# Patient Record
Sex: Male | Born: 1990
Health system: Southern US, Community
[De-identification: ages and names within clinical notes are randomized; demographics above are authoritative.]

---

## 2019-07-28 ENCOUNTER — Inpatient Hospital Stay (HOSPITAL_COMMUNITY): Payer: Medicaid - Out of State

## 2019-07-28 ENCOUNTER — Emergency Department (HOSPITAL_COMMUNITY): Payer: Medicaid - Out of State

## 2019-07-28 ENCOUNTER — Other Ambulatory Visit: Payer: Self-pay

## 2019-07-28 ENCOUNTER — Inpatient Hospital Stay (HOSPITAL_COMMUNITY)
Admission: EM | Admit: 2019-07-28 | Discharge: 2019-08-04 | DRG: 958 | Disposition: A | Discharge status: Home / Self Care | Payer: Medicaid - Out of State | Attending: General Surgery | Admitting: General Surgery

## 2019-07-28 ENCOUNTER — Encounter (HOSPITAL_COMMUNITY): Payer: Self-pay

## 2019-07-28 DIAGNOSIS — S73014A Posterior dislocation of right hip, initial encounter: Secondary | ICD-10-CM | POA: Diagnosis present

## 2019-07-28 DIAGNOSIS — S300XXA Contusion of lower back and pelvis, initial encounter: Secondary | ICD-10-CM | POA: Diagnosis present

## 2019-07-28 DIAGNOSIS — Z20822 Contact with and (suspected) exposure to covid-19: Secondary | ICD-10-CM | POA: Diagnosis present

## 2019-07-28 DIAGNOSIS — S32461A Displaced associated transverse-posterior fracture of right acetabulum, initial encounter for closed fracture: Principal | ICD-10-CM | POA: Diagnosis present

## 2019-07-28 DIAGNOSIS — S82871A Displaced pilon fracture of right tibia, initial encounter for closed fracture: Secondary | ICD-10-CM | POA: Diagnosis present

## 2019-07-28 DIAGNOSIS — Z880 Allergy status to penicillin: Secondary | ICD-10-CM

## 2019-07-28 DIAGNOSIS — S329XXA Fracture of unspecified parts of lumbosacral spine and pelvis, initial encounter for closed fracture: Secondary | ICD-10-CM

## 2019-07-28 DIAGNOSIS — J9690 Respiratory failure, unspecified, unspecified whether with hypoxia or hypercapnia: Secondary | ICD-10-CM

## 2019-07-28 DIAGNOSIS — S270XXA Traumatic pneumothorax, initial encounter: Secondary | ICD-10-CM | POA: Diagnosis present

## 2019-07-28 DIAGNOSIS — R7989 Other specified abnormal findings of blood chemistry: Secondary | ICD-10-CM | POA: Diagnosis present

## 2019-07-28 DIAGNOSIS — S32401A Unspecified fracture of right acetabulum, initial encounter for closed fracture: Secondary | ICD-10-CM

## 2019-07-28 DIAGNOSIS — S27321A Contusion of lung, unilateral, initial encounter: Secondary | ICD-10-CM | POA: Diagnosis present

## 2019-07-28 DIAGNOSIS — S9304XA Dislocation of right ankle joint, initial encounter: Secondary | ICD-10-CM | POA: Diagnosis present

## 2019-07-28 DIAGNOSIS — S27329A Contusion of lung, unspecified, initial encounter: Secondary | ICD-10-CM

## 2019-07-28 DIAGNOSIS — S80211A Abrasion, right knee, initial encounter: Secondary | ICD-10-CM | POA: Diagnosis present

## 2019-07-28 DIAGNOSIS — S2232XA Fracture of one rib, left side, initial encounter for closed fracture: Secondary | ICD-10-CM | POA: Diagnosis present

## 2019-07-28 DIAGNOSIS — S50311A Abrasion of right elbow, initial encounter: Secondary | ICD-10-CM | POA: Diagnosis present

## 2019-07-28 DIAGNOSIS — D62 Acute posthemorrhagic anemia: Secondary | ICD-10-CM | POA: Diagnosis not present

## 2019-07-28 DIAGNOSIS — Y92410 Unspecified street and highway as the place of occurrence of the external cause: Secondary | ICD-10-CM

## 2019-07-28 DIAGNOSIS — N179 Acute kidney failure, unspecified: Secondary | ICD-10-CM | POA: Diagnosis present

## 2019-07-28 DIAGNOSIS — S73004A Unspecified dislocation of right hip, initial encounter: Secondary | ICD-10-CM

## 2019-07-28 DIAGNOSIS — Z419 Encounter for procedure for purposes other than remedying health state, unspecified: Secondary | ICD-10-CM

## 2019-07-28 DIAGNOSIS — S93439A Sprain of tibiofibular ligament of unspecified ankle, initial encounter: Secondary | ICD-10-CM | POA: Diagnosis present

## 2019-07-28 DIAGNOSIS — S72009A Fracture of unspecified part of neck of unspecified femur, initial encounter for closed fracture: Secondary | ICD-10-CM | POA: Diagnosis present

## 2019-07-28 DIAGNOSIS — S93431A Sprain of tibiofibular ligament of right ankle, initial encounter: Secondary | ICD-10-CM

## 2019-07-28 DIAGNOSIS — S32810A Multiple fractures of pelvis with stable disruption of pelvic ring, initial encounter for closed fracture: Secondary | ICD-10-CM | POA: Insufficient documentation

## 2019-07-28 DIAGNOSIS — T1490XA Injury, unspecified, initial encounter: Secondary | ICD-10-CM

## 2019-07-28 LAB — RESPIRATORY PANEL BY RT PCR (FLU A&B, COVID)
Influenza A by PCR: NEGATIVE
Influenza B by PCR: NEGATIVE
SARS Coronavirus 2 by RT PCR: NEGATIVE

## 2019-07-28 LAB — I-STAT CHEM 8, ED
BUN: 12 mg/dL (ref 6–20)
Calcium, Ion: 1.1 mmol/L — ABNORMAL LOW (ref 1.15–1.40)
Chloride: 101 mmol/L (ref 98–111)
Creatinine, Ser: 1.5 mg/dL — ABNORMAL HIGH (ref 0.61–1.24)
Glucose, Bld: 154 mg/dL — ABNORMAL HIGH (ref 70–99)
HCT: 46 % (ref 39.0–52.0)
Hemoglobin: 15.6 g/dL (ref 13.0–17.0)
Potassium: 3.4 mmol/L — ABNORMAL LOW (ref 3.5–5.1)
Sodium: 138 mmol/L (ref 135–145)
TCO2: 27 mmol/L (ref 22–32)

## 2019-07-28 LAB — COMPREHENSIVE METABOLIC PANEL
ALT: 39 U/L (ref 0–44)
AST: 75 U/L — ABNORMAL HIGH (ref 15–41)
Albumin: 4.2 g/dL (ref 3.5–5.0)
Alkaline Phosphatase: 40 U/L (ref 38–126)
Anion gap: 15 (ref 5–15)
BUN: 11 mg/dL (ref 6–20)
CO2: 22 mmol/L (ref 22–32)
Calcium: 9.2 mg/dL (ref 8.9–10.3)
Chloride: 101 mmol/L (ref 98–111)
Creatinine, Ser: 1.7 mg/dL — ABNORMAL HIGH (ref 0.61–1.24)
GFR calc Af Amer: >60 mL/min (ref >60)
GFR calc non Af Amer: 54 mL/min — ABNORMAL LOW (ref >60)
Glucose, Bld: 164 mg/dL — ABNORMAL HIGH (ref 70–99)
Potassium: 3.6 mmol/L (ref 3.5–5.1)
Sodium: 138 mmol/L (ref 135–145)
Total Bilirubin: 2.7 mg/dL — ABNORMAL HIGH (ref 0.3–1.2)
Total Protein: 6.7 g/dL (ref 6.5–8.1)

## 2019-07-28 LAB — SAMPLE TO BLOOD BANK

## 2019-07-28 LAB — CDS SEROLOGY

## 2019-07-28 LAB — CBC
HCT: 45.8 % (ref 39.0–52.0)
Hemoglobin: 15.1 g/dL (ref 13.0–17.0)
MCH: 27.7 pg (ref 26.0–34.0)
MCHC: 33 g/dL (ref 30.0–36.0)
MCV: 83.9 fL (ref 80.0–100.0)
Platelets: 325 10*3/uL (ref 150–400)
RBC: 5.46 MIL/uL (ref 4.22–5.81)
RDW: 13 % (ref 11.5–15.5)
WBC: 14.9 10*3/uL — ABNORMAL HIGH (ref 4.0–10.5)
nRBC: 0 % (ref 0.0–0.2)

## 2019-07-28 LAB — PROTIME-INR
INR: 1.2 (ref 0.8–1.2)
Prothrombin Time: 15.2 seconds (ref 11.4–15.2)

## 2019-07-28 LAB — ETHANOL: Alcohol, Ethyl (B): <10 mg/dL (ref <10)

## 2019-07-28 MED ORDER — SODIUM CHLORIDE 0.9 % IV BOLUS
1000.0000 mL | Freq: Once | INTRAVENOUS | Status: AC
  Administered 2019-07-28: 1000 mL via INTRAVENOUS

## 2019-07-28 MED ORDER — ENOXAPARIN SODIUM 40 MG/0.4ML ~~LOC~~ SOLN
40.0000 mg | SUBCUTANEOUS | Status: DC
  Administered 2019-07-29: 40 mg via SUBCUTANEOUS
  Filled 2019-07-28: qty 0.4

## 2019-07-28 MED ORDER — MORPHINE SULFATE (PF) 2 MG/ML IV SOLN: 1.0000 mg | INTRAVENOUS | Status: DC | PRN

## 2019-07-28 MED ORDER — ONDANSETRON HCL 4 MG/2ML IJ SOLN
4.0000 mg | Freq: Four times a day (QID) | INTRAMUSCULAR | Status: DC | PRN
  Filled 2019-07-28 (×2): qty 2

## 2019-07-28 MED ORDER — DOCUSATE SODIUM 100 MG PO CAPS
100.0000 mg | ORAL_CAPSULE | Freq: Two times a day (BID) | ORAL | Status: DC
  Administered 2019-07-28: 100 mg via ORAL
  Filled 2019-07-28: qty 1

## 2019-07-28 MED ORDER — FENTANYL CITRATE (PF) 100 MCG/2ML IJ SOLN
50.0000 ug | Freq: Once | INTRAMUSCULAR | Status: AC
  Administered 2019-07-28: 50 ug via INTRAVENOUS

## 2019-07-28 MED ORDER — PHENYLEPHRINE HCL (PRESSORS) 10 MG/ML IV SOLN
INTRAVENOUS | Status: AC | PRN
  Administered 2019-07-28: 40 ug

## 2019-07-28 MED ORDER — PROPOFOL 10 MG/ML IV BOLUS: 50.0000 mg | Freq: Once | INTRAVENOUS | Status: DC

## 2019-07-28 MED ORDER — ACETAMINOPHEN 500 MG PO TABS
1000.0000 mg | ORAL_TABLET | Freq: Three times a day (TID) | ORAL | Status: DC
  Administered 2019-07-28: 1000 mg via ORAL
  Filled 2019-07-28: qty 2

## 2019-07-28 MED ORDER — POTASSIUM CHLORIDE IN NACL 20-0.9 MEQ/L-% IV SOLN
INTRAVENOUS | Status: DC
  Filled 2019-07-28 (×3): qty 1000

## 2019-07-28 MED ORDER — KETAMINE HCL 10 MG/ML IJ SOLN
INTRAMUSCULAR | Status: AC | PRN
  Administered 2019-07-28: 50 mg via INTRAVENOUS

## 2019-07-28 MED ORDER — PANTOPRAZOLE SODIUM 40 MG PO TBEC
40.0000 mg | DELAYED_RELEASE_TABLET | Freq: Every day | ORAL | Status: DC
  Administered 2019-07-28: 40 mg via ORAL
  Filled 2019-07-28: qty 1

## 2019-07-28 MED ORDER — PANTOPRAZOLE SODIUM 40 MG IV SOLR
40.0000 mg | Freq: Every day | INTRAVENOUS | Status: DC
  Administered 2019-07-29: 40 mg via INTRAVENOUS
  Filled 2019-07-28: qty 40

## 2019-07-28 MED ORDER — HYDROMORPHONE HCL 1 MG/ML IJ SOLN
1.0000 mg | INTRAMUSCULAR | Status: DC | PRN
  Administered 2019-07-28: 1 mg via INTRAVENOUS
  Filled 2019-07-28: qty 1

## 2019-07-28 MED ORDER — PROPOFOL 10 MG/ML IV BOLUS
INTRAVENOUS | Status: AC
  Filled 2019-07-28: qty 20

## 2019-07-28 MED ORDER — PROPOFOL 10 MG/ML IV BOLUS
INTRAVENOUS | Status: AC | PRN
  Administered 2019-07-28: 50 mg via INTRAVENOUS

## 2019-07-28 MED ORDER — OXYCODONE HCL 5 MG PO TABS
10.0000 mg | ORAL_TABLET | ORAL | Status: DC | PRN
  Administered 2019-07-28: 10 mg via ORAL
  Filled 2019-07-28: qty 2

## 2019-07-28 MED ORDER — IOHEXOL 300 MG/ML  SOLN
100.0000 mL | Freq: Once | INTRAMUSCULAR | Status: AC | PRN
  Administered 2019-07-28: 100 mL via INTRAVENOUS

## 2019-07-28 MED ORDER — METHOCARBAMOL 1000 MG/10ML IJ SOLN: 500.0000 mg | Freq: Three times a day (TID) | INTRAVENOUS | Status: DC | PRN

## 2019-07-28 MED ORDER — ONDANSETRON 4 MG PO TBDP: 4.0000 mg | ORAL_TABLET | Freq: Four times a day (QID) | ORAL | Status: DC | PRN

## 2019-07-28 MED ORDER — KETAMINE HCL 50 MG/5ML IJ SOSY
PREFILLED_SYRINGE | INTRAMUSCULAR | Status: AC
  Filled 2019-07-28: qty 10

## 2019-07-28 MED ORDER — OXYCODONE HCL 5 MG PO TABS: 5.0000 mg | ORAL_TABLET | ORAL | Status: DC | PRN

## 2019-07-28 NOTE — Progress Notes (Signed)
Orthopedic Tech Progress Note Patient Details:  David Peterson 01-03-1991 [id number]  Ortho Devices Type of Ortho Device: Knee Immobilizer Ortho Device/Splint Location: RLE Ortho Device/Splint Interventions: Application   Post Interventions Patient Tolerated: Well Instructions Provided: Care of device   David Peterson 07/28/2019, 7:43 PM

## 2019-07-28 NOTE — Consult Note (Signed)
Orthopedic consulted about this patient.  We reviewed the case with Dr. Jena Gauss.  He plans for operative fixation of the ankle as well as the acetabulum pending further imaging tonight.  Patient should be kept n.p.o. at midnight for surgery on 07/29/2019.  I have ordered additional imaging.
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
## 2019-07-28 NOTE — Progress Notes (Signed)
Orthopedic Tech Progress Note Patient Details:  David Peterson 1991/03/27 [id number]  Patient ID: David Peterson, male   DOB: Dec 28, 1990, 28 y.o.   MRN: [id number]   [name] 07/28/2019, 6:08 PMLevel 2 Trauma alert
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
## 2019-07-28 NOTE — Progress Notes (Signed)
Orthopedic Tech Progress Note Patient Details:  David Peterson 09-22-1990 [id number]  Ortho Devices Type of Ortho Device: Stirrup splint, Short leg splint Ortho Device/Splint Location: RLE Ortho Device/Splint Interventions: Application   Post Interventions Patient Tolerated: Well Instructions Provided: Care of device   David Peterson E Fisher Hargadon 07/28/2019, 9:17 PM
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
## 2019-07-29 ENCOUNTER — Inpatient Hospital Stay (HOSPITAL_COMMUNITY): Payer: Medicaid - Out of State | Admitting: Certified Registered"

## 2019-07-29 ENCOUNTER — Inpatient Hospital Stay (HOSPITAL_COMMUNITY): Payer: Medicaid - Out of State

## 2019-07-29 ENCOUNTER — Encounter (HOSPITAL_COMMUNITY): Admission: EM | Disposition: A | Discharge status: Home / Self Care | Payer: Self-pay | Source: Home / Self Care

## 2019-07-29 ENCOUNTER — Encounter (HOSPITAL_COMMUNITY): Payer: Self-pay

## 2019-07-29 HISTORY — PX: ORIF ACETABULAR FRACTURE: SHX5029

## 2019-07-29 HISTORY — PX: EXTERNAL FIXATION LEG: SHX1549

## 2019-07-29 LAB — COMPREHENSIVE METABOLIC PANEL
ALT: 61 U/L — ABNORMAL HIGH (ref 0–44)
AST: 182 U/L — ABNORMAL HIGH (ref 15–41)
Albumin: 3.6 g/dL (ref 3.5–5.0)
Alkaline Phosphatase: 38 U/L (ref 38–126)
Anion gap: 11 (ref 5–15)
BUN: 12 mg/dL (ref 6–20)
CO2: 24 mmol/L (ref 22–32)
Calcium: 8.7 mg/dL — ABNORMAL LOW (ref 8.9–10.3)
Chloride: 102 mmol/L (ref 98–111)
Creatinine, Ser: 1.37 mg/dL — ABNORMAL HIGH (ref 0.61–1.24)
GFR calc Af Amer: >60 mL/min (ref >60)
GFR calc non Af Amer: >60 mL/min (ref >60)
Glucose, Bld: 174 mg/dL — ABNORMAL HIGH (ref 70–99)
Potassium: 5 mmol/L (ref 3.5–5.1)
Sodium: 137 mmol/L (ref 135–145)
Total Bilirubin: 2.2 mg/dL — ABNORMAL HIGH (ref 0.3–1.2)
Total Protein: 6.1 g/dL — ABNORMAL LOW (ref 6.5–8.1)

## 2019-07-29 LAB — CBC
HCT: 40.1 % (ref 39.0–52.0)
Hemoglobin: 13.1 g/dL (ref 13.0–17.0)
MCH: 27.6 pg (ref 26.0–34.0)
MCHC: 32.7 g/dL (ref 30.0–36.0)
MCV: 84.6 fL (ref 80.0–100.0)
Platelets: 283 10*3/uL (ref 150–400)
RBC: 4.74 MIL/uL (ref 4.22–5.81)
RDW: 12.9 % (ref 11.5–15.5)
WBC: 23.5 10*3/uL — ABNORMAL HIGH (ref 4.0–10.5)
nRBC: 0 % (ref 0.0–0.2)

## 2019-07-29 LAB — SURGICAL PCR SCREEN
MRSA, PCR: NEGATIVE
Staphylococcus aureus: NEGATIVE

## 2019-07-29 LAB — HIV ANTIBODY (ROUTINE TESTING W REFLEX): HIV Screen 4th Generation wRfx: NONREACTIVE

## 2019-07-29 SURGERY — OPEN REDUCTION INTERNAL FIXATION (ORIF) ACETABULAR FRACTURE
Anesthesia: General | Site: Shoulder | Laterality: Right

## 2019-07-29 MED ORDER — TOBRAMYCIN SULFATE 1.2 G IJ SOLR
INTRAMUSCULAR | Status: DC | PRN
  Administered 2019-07-29: 1.2 g via TOPICAL

## 2019-07-29 MED ORDER — ONDANSETRON HCL 4 MG/2ML IJ SOLN
4.0000 mg | Freq: Once | INTRAMUSCULAR | Status: AC | PRN
  Administered 2019-07-29: 4 mg via INTRAVENOUS

## 2019-07-29 MED ORDER — MORPHINE SULFATE (PF) 4 MG/ML IV SOLN: 4.0000 mg | INTRAVENOUS | Status: DC | PRN

## 2019-07-29 MED ORDER — ONDANSETRON HCL 4 MG/2ML IJ SOLN
INTRAMUSCULAR | Status: AC
  Filled 2019-07-29: qty 2

## 2019-07-29 MED ORDER — SODIUM CHLORIDE 0.9 % IV SOLN: INTRAVENOUS | Status: DC

## 2019-07-29 MED ORDER — ONDANSETRON HCL 4 MG/2ML IJ SOLN
4.0000 mg | Freq: Four times a day (QID) | INTRAMUSCULAR | Status: DC | PRN
  Administered 2019-08-03: 4 mg via INTRAVENOUS
  Filled 2019-07-29 (×3): qty 2

## 2019-07-29 MED ORDER — ENOXAPARIN SODIUM 40 MG/0.4ML ~~LOC~~ SOLN: 40.0000 mg | SUBCUTANEOUS | Status: DC

## 2019-07-29 MED ORDER — PHENYLEPHRINE HCL-NACL 10-0.9 MG/250ML-% IV SOLN
INTRAVENOUS | Status: DC | PRN
  Administered 2019-07-29: 50 ug/min via INTRAVENOUS

## 2019-07-29 MED ORDER — DEXAMETHASONE SODIUM PHOSPHATE 10 MG/ML IJ SOLN
INTRAMUSCULAR | Status: DC | PRN
  Administered 2019-07-29: 10 mg via INTRAVENOUS

## 2019-07-29 MED ORDER — VANCOMYCIN HCL 1000 MG IV SOLR
INTRAVENOUS | Status: AC
  Filled 2019-07-29: qty 1000

## 2019-07-29 MED ORDER — METHOCARBAMOL 500 MG PO TABS: 500.0000 mg | ORAL_TABLET | Freq: Four times a day (QID) | ORAL | Status: DC | PRN

## 2019-07-29 MED ORDER — ENOXAPARIN SODIUM 30 MG/0.3ML ~~LOC~~ SOLN: 30.0000 mg | Freq: Two times a day (BID) | SUBCUTANEOUS | Status: DC

## 2019-07-29 MED ORDER — ONDANSETRON HCL 4 MG PO TABS: 4.0000 mg | ORAL_TABLET | Freq: Four times a day (QID) | ORAL | Status: DC | PRN

## 2019-07-29 MED ORDER — LIDOCAINE 2% (20 MG/ML) 5 ML SYRINGE
INTRAMUSCULAR | Status: AC
  Filled 2019-07-29: qty 5

## 2019-07-29 MED ORDER — HYDROMORPHONE HCL 1 MG/ML IJ SOLN
0.2500 mg | INTRAMUSCULAR | Status: DC | PRN
  Administered 2019-07-29 (×2): 0.5 mg via INTRAVENOUS

## 2019-07-29 MED ORDER — POVIDONE-IODINE 10 % EX SWAB: 2.0000 "application " | Freq: Once | CUTANEOUS | Status: DC

## 2019-07-29 MED ORDER — MIDAZOLAM HCL 2 MG/2ML IJ SOLN
INTRAMUSCULAR | Status: AC
  Filled 2019-07-29: qty 2

## 2019-07-29 MED ORDER — LIDOCAINE 2% (20 MG/ML) 5 ML SYRINGE
INTRAMUSCULAR | Status: DC | PRN
  Administered 2019-07-29: 100 mg via INTRAVENOUS

## 2019-07-29 MED ORDER — FENTANYL CITRATE (PF) 250 MCG/5ML IJ SOLN
INTRAMUSCULAR | Status: AC
  Filled 2019-07-29: qty 5

## 2019-07-29 MED ORDER — GABAPENTIN 100 MG PO CAPS
100.0000 mg | ORAL_CAPSULE | Freq: Three times a day (TID) | ORAL | Status: DC
  Administered 2019-07-30 – 2019-08-01 (×9): 100 mg via ORAL
  Filled 2019-07-29 (×10): qty 1

## 2019-07-29 MED ORDER — CEFAZOLIN SODIUM-DEXTROSE 2-3 GM-%(50ML) IV SOLR
INTRAVENOUS | Status: DC | PRN
  Administered 2019-07-29: 2 g via INTRAVENOUS

## 2019-07-29 MED ORDER — VANCOMYCIN HCL IN DEXTROSE 1-5 GM/200ML-% IV SOLN
INTRAVENOUS | Status: AC
  Administered 2019-07-29: 1000 mg via INTRAVENOUS
  Filled 2019-07-29: qty 200

## 2019-07-29 MED ORDER — DEXMEDETOMIDINE HCL IN NACL 200 MCG/50ML IV SOLN
INTRAVENOUS | Status: AC
  Filled 2019-07-29: qty 50

## 2019-07-29 MED ORDER — LACTATED RINGERS IV SOLN: INTRAVENOUS | Status: DC

## 2019-07-29 MED ORDER — PHENYLEPHRINE 40 MCG/ML (10ML) SYRINGE FOR IV PUSH (FOR BLOOD PRESSURE SUPPORT)
PREFILLED_SYRINGE | INTRAVENOUS | Status: DC | PRN
  Administered 2019-07-29 (×3): 80 ug via INTRAVENOUS
  Administered 2019-07-29 (×2): 120 ug via INTRAVENOUS

## 2019-07-29 MED ORDER — ALBUMIN HUMAN 5 % IV SOLN: INTRAVENOUS | Status: DC | PRN

## 2019-07-29 MED ORDER — MIDAZOLAM HCL 2 MG/2ML IJ SOLN
INTRAMUSCULAR | Status: DC | PRN
  Administered 2019-07-29: 2 mg via INTRAVENOUS

## 2019-07-29 MED ORDER — VANCOMYCIN HCL IN DEXTROSE 1-5 GM/200ML-% IV SOLN: 1000.0000 mg | INTRAVENOUS | Status: AC

## 2019-07-29 MED ORDER — DEXAMETHASONE SODIUM PHOSPHATE 10 MG/ML IJ SOLN
INTRAMUSCULAR | Status: AC
  Filled 2019-07-29: qty 1

## 2019-07-29 MED ORDER — ROCURONIUM BROMIDE 10 MG/ML (PF) SYRINGE
PREFILLED_SYRINGE | INTRAVENOUS | Status: AC
  Filled 2019-07-29: qty 20

## 2019-07-29 MED ORDER — HYDROMORPHONE HCL 1 MG/ML IJ SOLN: 0.5000 mg | INTRAMUSCULAR | Status: DC | PRN

## 2019-07-29 MED ORDER — DEXMEDETOMIDINE HCL 200 MCG/2ML IV SOLN
INTRAVENOUS | Status: DC | PRN
  Administered 2019-07-29: 20 ug via INTRAVENOUS

## 2019-07-29 MED ORDER — METOCLOPRAMIDE HCL 5 MG/ML IJ SOLN: 5.0000 mg | Freq: Three times a day (TID) | INTRAMUSCULAR | Status: DC | PRN

## 2019-07-29 MED ORDER — CHLORHEXIDINE GLUCONATE 4 % EX LIQD: 60.0000 mL | Freq: Once | CUTANEOUS | Status: DC

## 2019-07-29 MED ORDER — ONDANSETRON HCL 4 MG/2ML IJ SOLN
INTRAMUSCULAR | Status: DC | PRN
  Administered 2019-07-29: 4 mg via INTRAVENOUS

## 2019-07-29 MED ORDER — CEFAZOLIN SODIUM-DEXTROSE 2-4 GM/100ML-% IV SOLN
2.0000 g | Freq: Three times a day (TID) | INTRAVENOUS | Status: AC
  Administered 2019-07-30 (×3): 2 g via INTRAVENOUS
  Filled 2019-07-29 (×3): qty 100

## 2019-07-29 MED ORDER — PROPOFOL 10 MG/ML IV BOLUS
INTRAVENOUS | Status: AC
  Filled 2019-07-29: qty 20

## 2019-07-29 MED ORDER — DOCUSATE SODIUM 100 MG PO CAPS
100.0000 mg | ORAL_CAPSULE | Freq: Two times a day (BID) | ORAL | Status: DC
  Administered 2019-07-30 – 2019-08-03 (×9): 100 mg via ORAL
  Filled 2019-07-29 (×11): qty 1

## 2019-07-29 MED ORDER — TOBRAMYCIN SULFATE 1.2 G IJ SOLR
INTRAMUSCULAR | Status: AC
  Filled 2019-07-29: qty 1.2

## 2019-07-29 MED ORDER — PHENYLEPHRINE 40 MCG/ML (10ML) SYRINGE FOR IV PUSH (FOR BLOOD PRESSURE SUPPORT)
PREFILLED_SYRINGE | INTRAVENOUS | Status: AC
  Filled 2019-07-29: qty 10

## 2019-07-29 MED ORDER — METHOCARBAMOL 500 MG PO TABS: 1000.0000 mg | ORAL_TABLET | Freq: Three times a day (TID) | ORAL | Status: DC

## 2019-07-29 MED ORDER — VANCOMYCIN HCL 1 G IV SOLR
INTRAVENOUS | Status: DC | PRN
  Administered 2019-07-29: 1000 mg via TOPICAL

## 2019-07-29 MED ORDER — ROCURONIUM BROMIDE 10 MG/ML (PF) SYRINGE
PREFILLED_SYRINGE | INTRAVENOUS | Status: DC | PRN
  Administered 2019-07-29: 20 mg via INTRAVENOUS
  Administered 2019-07-29: 50 mg via INTRAVENOUS
  Administered 2019-07-29: 30 mg via INTRAVENOUS
  Administered 2019-07-29: 80 mg via INTRAVENOUS
  Administered 2019-07-29: 20 mg via INTRAVENOUS

## 2019-07-29 MED ORDER — HYDROMORPHONE HCL 1 MG/ML IJ SOLN
INTRAMUSCULAR | Status: AC
  Filled 2019-07-29: qty 1

## 2019-07-29 MED ORDER — POLYETHYLENE GLYCOL 3350 17 G PO PACK: 17.0000 g | PACK | Freq: Every day | ORAL | Status: DC | PRN

## 2019-07-29 MED ORDER — 0.9 % SODIUM CHLORIDE (POUR BTL) OPTIME
TOPICAL | Status: DC | PRN
  Administered 2019-07-29: 1000 mL

## 2019-07-29 MED ORDER — FENTANYL CITRATE (PF) 250 MCG/5ML IJ SOLN
INTRAMUSCULAR | Status: DC | PRN
  Administered 2019-07-29: 150 ug via INTRAVENOUS
  Administered 2019-07-29 (×2): 50 ug via INTRAVENOUS

## 2019-07-29 MED ORDER — METHOCARBAMOL 1000 MG/10ML IJ SOLN: 500.0000 mg | Freq: Four times a day (QID) | INTRAVENOUS | Status: DC | PRN

## 2019-07-29 MED ORDER — METOCLOPRAMIDE HCL 5 MG PO TABS: 5.0000 mg | ORAL_TABLET | Freq: Three times a day (TID) | ORAL | Status: DC | PRN

## 2019-07-29 MED ORDER — OXYCODONE HCL 5 MG PO TABS
10.0000 mg | ORAL_TABLET | ORAL | Status: DC | PRN
  Administered 2019-07-30 – 2019-08-04 (×12): 15 mg via ORAL
  Filled 2019-07-29 (×14): qty 3

## 2019-07-29 MED ORDER — ACETAMINOPHEN 500 MG PO TABS
1000.0000 mg | ORAL_TABLET | Freq: Four times a day (QID) | ORAL | Status: DC
  Administered 2019-07-30 – 2019-08-04 (×11): 1000 mg via ORAL
  Filled 2019-07-29 (×17): qty 2

## 2019-07-29 MED ORDER — MEPERIDINE HCL 25 MG/ML IJ SOLN: 6.2500 mg | INTRAMUSCULAR | Status: DC | PRN

## 2019-07-29 MED ORDER — PROPOFOL 10 MG/ML IV BOLUS
INTRAVENOUS | Status: DC | PRN
  Administered 2019-07-29: 150 mg via INTRAVENOUS
  Administered 2019-07-29: 50 mg via INTRAVENOUS

## 2019-07-29 MED ORDER — SUGAMMADEX SODIUM 200 MG/2ML IV SOLN
INTRAVENOUS | Status: DC | PRN
  Administered 2019-07-29: 200 mg via INTRAVENOUS

## 2019-07-29 MED ORDER — OXYCODONE HCL 5 MG PO TABS
5.0000 mg | ORAL_TABLET | ORAL | Status: DC | PRN
  Administered 2019-08-02 – 2019-08-03 (×5): 10 mg via ORAL
  Filled 2019-07-29 (×6): qty 2

## 2019-07-29 SURGICAL SUPPLY — 107 items
BIT DRILL CANN 4.5MM (BIT) ×4 IMPLANT
BIT DRILL STEP 3.5 (DRILL) IMPLANT
BLADE CLIPPER SURG (BLADE) IMPLANT
BNDG COHESIVE 4X5 TAN STRL (GAUZE/BANDAGES/DRESSINGS) ×4 IMPLANT
BNDG ELASTIC 4X5.8 VLCR STR LF (GAUZE/BANDAGES/DRESSINGS) ×4 IMPLANT
BNDG ELASTIC 6X5.8 VLCR STR LF (GAUZE/BANDAGES/DRESSINGS) ×4 IMPLANT
BNDG GAUZE ELAST 4 BULKY (GAUZE/BANDAGES/DRESSINGS) ×4 IMPLANT
BRUSH SCRUB EZ PLAIN DRY (MISCELLANEOUS) ×8 IMPLANT
CHLORAPREP W/TINT 26 (MISCELLANEOUS) ×4 IMPLANT
CLAMP COMBI 8.0/11.0 LRG/MED (Clamp) ×8 IMPLANT
CLAMP COMBO MED CLIP-ON SELF (EXFIX) ×16 IMPLANT
CLAMP LG MULTI PIN (Clamp) ×4 IMPLANT
CLAMP ROD ATTACHMENT (Clamp) ×4 IMPLANT
CLOSURE WOUND 1/2 X4 (GAUZE/BANDAGES/DRESSINGS)
COVER SURGICAL LIGHT HANDLE (MISCELLANEOUS) ×8 IMPLANT
COVER WAND RF STERILE (DRAPES) ×4 IMPLANT
DERMABOND ADVANCED (GAUZE/BANDAGES/DRESSINGS) ×4
DERMABOND ADVANCED .7 DNX12 (GAUZE/BANDAGES/DRESSINGS) ×4 IMPLANT
DRAPE C-ARM 42X72 X-RAY (DRAPES) ×12 IMPLANT
DRAPE C-ARMOR (DRAPES) ×4 IMPLANT
DRAPE IMP U-DRAPE 54X76 (DRAPES) ×8 IMPLANT
DRAPE INCISE IOBAN 66X45 STRL (DRAPES) ×8 IMPLANT
DRAPE INCISE IOBAN 85X60 (DRAPES) ×4 IMPLANT
DRAPE ORTHO SPLIT 77X108 STRL (DRAPES) ×4
DRAPE SURG ORHT 6 SPLT 77X108 (DRAPES) ×4 IMPLANT
DRAPE U-SHAPE 47X51 STRL (DRAPES) ×4 IMPLANT
DRILL BIT CANN 4.5MM (BIT) ×4
DRILL STEP 3.5 (DRILL)
DRSG ADAPTIC 3X8 NADH LF (GAUZE/BANDAGES/DRESSINGS) ×4 IMPLANT
DRSG MEPILEX BORDER 4X12 (GAUZE/BANDAGES/DRESSINGS) ×4 IMPLANT
DRSG MEPILEX BORDER 4X4 (GAUZE/BANDAGES/DRESSINGS) ×4 IMPLANT
DRSG MEPILEX BORDER 4X8 (GAUZE/BANDAGES/DRESSINGS) IMPLANT
DRSG MEPITEL 4X7.2 (GAUZE/BANDAGES/DRESSINGS) IMPLANT
ELECT BLADE 4.0 EZ CLEAN MEGAD (MISCELLANEOUS) ×4
ELECT BLADE 6.5 EXT (BLADE) ×4 IMPLANT
ELECT REM PT RETURN 9FT ADLT (ELECTROSURGICAL) ×4
ELECTRODE BLDE 4.0 EZ CLN MEGD (MISCELLANEOUS) ×2 IMPLANT
ELECTRODE REM PT RTRN 9FT ADLT (ELECTROSURGICAL) ×2 IMPLANT
GAUZE SPONGE 4X4 12PLY STRL (GAUZE/BANDAGES/DRESSINGS) ×4 IMPLANT
GLOVE BIO SURGEON STRL SZ 6.5 (GLOVE) ×9 IMPLANT
GLOVE BIO SURGEON STRL SZ7.5 (GLOVE) ×16 IMPLANT
GLOVE BIO SURGEONS STRL SZ 6.5 (GLOVE) ×3
GLOVE BIOGEL PI IND STRL 6.5 (GLOVE) ×2 IMPLANT
GLOVE BIOGEL PI IND STRL 7.5 (GLOVE) ×2 IMPLANT
GLOVE BIOGEL PI INDICATOR 6.5 (GLOVE) ×2
GLOVE BIOGEL PI INDICATOR 7.5 (GLOVE) ×2
GOWN STRL REUS W/ TWL LRG LVL3 (GOWN DISPOSABLE) ×4 IMPLANT
GOWN STRL REUS W/TWL LRG LVL3 (GOWN DISPOSABLE) ×4
GUIDEWARE NON THREAD 1.25X150 (WIRE) ×4
GUIDEWIRE 2.0MM (WIRE) ×8 IMPLANT
GUIDEWIRE NON THREAD 1.25X150 (WIRE) ×2 IMPLANT
GUIDEWIRE THREADED 2.8 (WIRE) ×4 IMPLANT
HANDPIECE INTERPULSE COAX TIP (DISPOSABLE) ×2
KIT BASIN OR (CUSTOM PROCEDURE TRAY) ×4 IMPLANT
KIT TURNOVER KIT B (KITS) ×4 IMPLANT
MANIFOLD NEPTUNE II (INSTRUMENTS) ×4 IMPLANT
NEEDLE 22X1 1/2 (OR ONLY) (NEEDLE) IMPLANT
NS IRRIG 1000ML POUR BTL (IV SOLUTION) ×4 IMPLANT
PACK ORTHO EXTREMITY (CUSTOM PROCEDURE TRAY) ×4 IMPLANT
PACK TOTAL JOINT (CUSTOM PROCEDURE TRAY) ×4 IMPLANT
PAD ARMBOARD 7.5X6 YLW CONV (MISCELLANEOUS) ×8 IMPLANT
PAD CAST 4YDX4 CTTN HI CHSV (CAST SUPPLIES) ×2 IMPLANT
PADDING CAST COTTON 4X4 STRL (CAST SUPPLIES) ×2
PADDING CAST COTTON 6X4 STRL (CAST SUPPLIES) ×8 IMPLANT
PIN SHANZ TRANSFIX 6.0X225 (PIN) ×4 IMPLANT
PLATE BONE LOCK 65MM 5 HOLE (Plate) ×4 IMPLANT
RETRIEVER SUT HEWSON (MISCELLANEOUS) ×4 IMPLANT
ROD CARBON FIBER 500MM (Rod) ×8 IMPLANT
ROD CARBON FIBER 8.0MMX200MM (Rod) ×8 IMPLANT
SCREW CANN 16 THRD/125 7.3 (Screw) ×4 IMPLANT
SCREW CANN 16 THRD/95 7.3 (Screw) ×4 IMPLANT
SCREW CORTEX 3.5 38MM (Screw) ×2 IMPLANT
SCREW CORTEX 3.5X40MM (Screw) ×8 IMPLANT
SCREW CORTEX 3.5X50MM (Screw) ×2 IMPLANT
SCREW LOCK CORT ST 3.5X38 (Screw) ×2 IMPLANT
SCREW PELVIC CORT ST 3.5X50 (Screw) ×2 IMPLANT
SCREW PELVIC CORT ST 3.5X60 (Screw) ×2 IMPLANT
SCREW SELF TAP 3.5 60M (Screw) ×2 IMPLANT
SCREW SHANZ 4.0X100 (EXFIX) ×8 IMPLANT
SCREW SHANZ 5.0X175MM (Screw) ×8 IMPLANT
SET HNDPC FAN SPRY TIP SCT (DISPOSABLE) ×2 IMPLANT
SPONGE LAP 18X18 RF (DISPOSABLE) ×4 IMPLANT
STAPLER VISISTAT 35W (STAPLE) ×4 IMPLANT
STRIP CLOSURE SKIN 1/2X4 (GAUZE/BANDAGES/DRESSINGS) IMPLANT
SUCTION FRAZIER HANDLE 10FR (MISCELLANEOUS) ×2
SUCTION TUBE FRAZIER 10FR DISP (MISCELLANEOUS) ×2 IMPLANT
SUT ETHILON 2 0 PSLX (SUTURE) ×8 IMPLANT
SUT ETHILON 3 0 PS 1 (SUTURE) ×4 IMPLANT
SUT FIBERWIRE #2 38 T-5 BLUE (SUTURE) ×8
SUT MNCRL AB 3-0 PS2 18 (SUTURE) ×4 IMPLANT
SUT MON AB 2-0 CT1 36 (SUTURE) ×4 IMPLANT
SUT PROLENE 0 CT (SUTURE) IMPLANT
SUT VIC AB 0 CT1 27 (SUTURE) ×2
SUT VIC AB 0 CT1 27XBRD ANBCTR (SUTURE) ×2 IMPLANT
SUT VIC AB 1 CT1 18XCR BRD 8 (SUTURE) ×2 IMPLANT
SUT VIC AB 1 CT1 27 (SUTURE) ×2
SUT VIC AB 1 CT1 27XBRD ANBCTR (SUTURE) ×2 IMPLANT
SUT VIC AB 1 CT1 8-18 (SUTURE) ×2
SUT VIC AB 2-0 CT1 27 (SUTURE) ×2
SUT VIC AB 2-0 CT1 TAPERPNT 27 (SUTURE) ×2 IMPLANT
SUTURE FIBERWR #2 38 T-5 BLUE (SUTURE) ×4 IMPLANT
TOWEL GREEN STERILE (TOWEL DISPOSABLE) ×8 IMPLANT
TOWEL GREEN STERILE FF (TOWEL DISPOSABLE) ×8 IMPLANT
TRAY FOLEY MTR SLVR 16FR STAT (SET/KITS/TRAYS/PACK) IMPLANT
UNDERPAD 30X30 (UNDERPADS AND DIAPERS) ×4 IMPLANT
WASHER FOR 5.0 SCREWS (Washer) ×4 IMPLANT
WATER STERILE IRR 1000ML POUR (IV SOLUTION) IMPLANT
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
## 2019-07-29 NOTE — Transfer of Care (Signed)
Immediate Anesthesia Transfer of Care Note  Patient: David Peterson  Procedure(s) Performed: OPEN REDUCTION INTERNAL FIXATION (ORIF) ACETABULAR FRACTURE (Right Shoulder) EXTERNAL FIXATION LEG (Right )  Patient Location: PACU  Anesthesia Type:General  Level of Consciousness: sedated, drowsy, patient cooperative and responds to stimulation  Airway & Oxygen Therapy: Patient Spontanous Breathing and Patient connected to nasal cannula oxygen  Post-op Assessment: Report given to RN, Post -op Vital signs reviewed and stable and Patient moving all extremities X 4  Post vital signs: Reviewed and stable  Last Vitals:  Vitals Value Taken Time  BP 132/70 07/29/19 2148  Temp    Pulse 91 07/29/19 2153  Resp 12 07/29/19 2153  SpO2 100 % 07/29/19 2153  Vitals shown include unvalidated device data.  Last Pain:  Vitals:   07/29/19 0920  TempSrc: Oral  PainSc:       Patients Stated Pain Goal: 3 (07/29/19 0920)  Complications: No apparent anesthesia complications

## 2019-07-29 NOTE — Anesthesia Preprocedure Evaluation (Signed)
Anesthesia Evaluation  Patient identified by MRN, date of birth, ID band Patient awake    Reviewed: Allergy & Precautions, NPO status , Patient's Chart, lab work & pertinent test results  Airway Mallampati: II  TM Distance: >3 FB Neck ROM: Full    Dental no notable dental hx. (+) Teeth Intact   Pulmonary  Pulmonary contusion   Pulmonary exam normal breath sounds clear to auscultation       Cardiovascular negative cardio ROS Normal cardiovascular exam Rhythm:Regular Rate:Normal     Neuro/Psych negative neurological ROS  negative psych ROS   GI/Hepatic negative GI ROS, Neg liver ROS,   Endo/Other  negative endocrine ROS  Renal/GU negative Renal ROS     Musculoskeletal Right acetabular Fx Right ankle Fx   Abdominal   Peds  Hematology negative hematology ROS (+)   Anesthesia Other Findings   Reproductive/Obstetrics                             Anesthesia Physical Anesthesia Plan  ASA: I  Anesthesia Plan: General   Post-op Pain Management:    Induction: Intravenous  PONV Risk Score and Plan: 2 and Ondansetron, Dexamethasone, Midazolam and Treatment may vary due to age or medical condition  Airway Management Planned: Oral ETT  Additional Equipment:   Intra-op Plan:   Post-operative Plan: Extubation in OR  Informed Consent: I have reviewed the patients History and Physical, chart, labs and discussed the procedure including the risks, benefits and alternatives for the proposed anesthesia with the patient or authorized representative who has indicated his/her understanding and acceptance.     Dental advisory given  Plan Discussed with: CRNA and Surgeon  Anesthesia Plan Comments:         Anesthesia Quick Evaluation

## 2019-07-29 NOTE — Anesthesia Procedure Notes (Signed)
Procedure Name: Intubation Date/Time: 07/29/2019 4:38 PM Performed by: Adair Laundry, CRNA Pre-anesthesia Checklist: Suction available, Emergency Drugs available and Patient being monitored Patient Re-evaluated:Patient Re-evaluated prior to induction Oxygen Delivery Method: Circle system utilized Preoxygenation: Pre-oxygenation with 100% oxygen Induction Type: IV induction Ventilation: Mask ventilation without difficulty Laryngoscope Size: Miller and 2 Grade View: Grade I Tube type: Oral Tube size: 7.5 mm Number of attempts: 1 Airway Equipment and Method: Stylet Placement Confirmation: ETT inserted through vocal cords under direct vision,  positive ETCO2 and breath sounds checked- equal and bilateral Secured at: 23 cm Tube secured with: Tape Dental Injury: Teeth and Oropharynx as per pre-operative assessment

## 2019-07-30 ENCOUNTER — Inpatient Hospital Stay (HOSPITAL_COMMUNITY): Payer: Medicaid - Out of State

## 2019-07-30 LAB — BASIC METABOLIC PANEL
Anion gap: 9 (ref 5–15)
BUN: 15 mg/dL (ref 6–20)
CO2: 25 mmol/L (ref 22–32)
Calcium: 8.3 mg/dL — ABNORMAL LOW (ref 8.9–10.3)
Chloride: 101 mmol/L (ref 98–111)
Creatinine, Ser: 1.28 mg/dL — ABNORMAL HIGH (ref 0.61–1.24)
GFR calc Af Amer: >60 mL/min (ref >60)
GFR calc non Af Amer: >60 mL/min (ref >60)
Glucose, Bld: 141 mg/dL — ABNORMAL HIGH (ref 70–99)
Potassium: 4.5 mmol/L (ref 3.5–5.1)
Sodium: 135 mmol/L (ref 135–145)

## 2019-07-30 LAB — CBC
HCT: 24.9 % — ABNORMAL LOW (ref 39.0–52.0)
Hemoglobin: 8.4 g/dL — ABNORMAL LOW (ref 13.0–17.0)
MCH: 27.8 pg (ref 26.0–34.0)
MCHC: 33.7 g/dL (ref 30.0–36.0)
MCV: 82.5 fL (ref 80.0–100.0)
Platelets: 178 10*3/uL (ref 150–400)
RBC: 3.02 MIL/uL — ABNORMAL LOW (ref 4.22–5.81)
RDW: 12.8 % (ref 11.5–15.5)
WBC: 15.1 10*3/uL — ABNORMAL HIGH (ref 4.0–10.5)
nRBC: 0 % (ref 0.0–0.2)

## 2019-07-30 LAB — PHOSPHORUS: Phosphorus: 3.1 mg/dL (ref 2.5–4.6)

## 2019-07-30 LAB — VITAMIN D 25 HYDROXY (VIT D DEFICIENCY, FRACTURES): Vit D, 25-Hydroxy: 10.66 ng/mL — ABNORMAL LOW (ref 30–100)

## 2019-07-30 LAB — MAGNESIUM: Magnesium: 1.7 mg/dL (ref 1.7–2.4)

## 2019-07-30 MED ORDER — ENOXAPARIN SODIUM 40 MG/0.4ML ~~LOC~~ SOLN
40.0000 mg | SUBCUTANEOUS | Status: DC
  Filled 2019-07-30: qty 0.4
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
## 2019-07-30 NOTE — Plan of Care (Signed)

## 2019-07-30 NOTE — Progress Notes (Signed)
Rehab Admissions Coordinator Note:  Per PT and OT recommendation, this patient was screened by Cheri Rous for appropriateness for an Inpatient Acute Rehab Consult.  Noted pt unable to tolerate transfers OOB at this time and he is awaiting definitive fixation of his RLE that may be up to a week away. Will continue to follow pt progress while on acute and request consult order once appropriate.    Cheri Rous 07/30/2019, 4:38 PM  I can be reached at [phone].

## 2019-07-30 NOTE — Progress Notes (Signed)
Pt. returned to 6N14 from PACU in stable condition. Alert and oriented x4, VSS, pt. was reoriented to room and to call bell. Pt. is sleeping but easily aroused and has no complaints at this time. Will continue to monitor.

## 2019-07-30 NOTE — Plan of Care (Signed)
  Problem: Education: Goal: Knowledge of General Education information will improve Description: Including pain rating scale, medication(s)/side effects and non-pharmacologic comfort measures Outcome: Progressing   Problem: Clinical Measurements: Goal: Ability to maintain clinical measurements within normal limits will improve Outcome: Progressing Goal: Will remain free from infection Outcome: Progressing Goal: Diagnostic test results will improve Outcome: Progressing Goal: Respiratory complications will improve Outcome: Progressing Goal: Cardiovascular complication will be avoided Outcome: Progressing   Problem: Nutrition: Goal: Adequate nutrition will be maintained Outcome: Progressing   Problem: Coping: Goal: Level of anxiety will decrease Outcome: Progressing   Problem: Pain Managment: Goal: General experience of comfort will improve Outcome: Progressing   Problem: Safety: Goal: Ability to remain free from injury will improve Outcome: Progressing   Problem: Skin Integrity: Goal: Risk for impaired skin integrity will decrease Outcome: Progressing   Problem: Education: Goal: Required Educational Video(s) Outcome: Progressing   Problem: Clinical Measurements: Goal: Ability to maintain clinical measurements within normal limits will improve Outcome: Progressing Goal: Postoperative complications will be avoided or minimized Outcome: Progressing   Problem: Skin Integrity: Goal: Demonstration of wound healing without infection will improve Outcome: Progressing
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
## 2019-07-30 NOTE — Progress Notes (Signed)
Patient refusing lovenox injection despite explanation .

## 2019-07-31 LAB — CBC
HCT: 21.9 % — ABNORMAL LOW (ref 39.0–52.0)
Hemoglobin: 7.4 g/dL — ABNORMAL LOW (ref 13.0–17.0)
MCH: 28 pg (ref 26.0–34.0)
MCHC: 33.8 g/dL (ref 30.0–36.0)
MCV: 83 fL (ref 80.0–100.0)
Platelets: 148 10*3/uL — ABNORMAL LOW (ref 150–400)
RBC: 2.64 MIL/uL — ABNORMAL LOW (ref 4.22–5.81)
RDW: 12.7 % (ref 11.5–15.5)
WBC: 9.6 10*3/uL (ref 4.0–10.5)
nRBC: 0 % (ref 0.0–0.2)

## 2019-07-31 LAB — BASIC METABOLIC PANEL
Anion gap: 7 (ref 5–15)
BUN: 7 mg/dL (ref 6–20)
CO2: 30 mmol/L (ref 22–32)
Calcium: 8 mg/dL — ABNORMAL LOW (ref 8.9–10.3)
Chloride: 100 mmol/L (ref 98–111)
Creatinine, Ser: 0.93 mg/dL (ref 0.61–1.24)
GFR calc Af Amer: >60 mL/min (ref >60)
GFR calc non Af Amer: >60 mL/min (ref >60)
Glucose, Bld: 106 mg/dL — ABNORMAL HIGH (ref 70–99)
Potassium: 3.9 mmol/L (ref 3.5–5.1)
Sodium: 137 mmol/L (ref 135–145)

## 2019-07-31 LAB — MAGNESIUM: Magnesium: 1.9 mg/dL (ref 1.7–2.4)

## 2019-07-31 LAB — PHOSPHORUS: Phosphorus: 2.3 mg/dL — ABNORMAL LOW (ref 2.5–4.6)

## 2019-07-31 NOTE — Plan of Care (Signed)
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
## 2019-07-31 NOTE — Anesthesia Postprocedure Evaluation (Signed)
Anesthesia Post Note  Patient: David Peterson  Procedure(s) Performed: OPEN REDUCTION INTERNAL FIXATION (ORIF) ACETABULAR FRACTURE (Right Shoulder) EXTERNAL FIXATION LEG (Right )     Patient location during evaluation: PACU Anesthesia Type: General Level of consciousness: awake and alert Pain management: pain level controlled Vital Signs Assessment: post-procedure vital signs reviewed and stable Respiratory status: spontaneous breathing, nonlabored ventilation, respiratory function stable and patient connected to nasal cannula oxygen Cardiovascular status: blood pressure returned to baseline and stable Postop Assessment: no apparent nausea or vomiting Anesthetic complications: no    Last Vitals:  Vitals:   07/30/19 2037 07/31/19 0358  BP: (!) 141/84 129/68  Pulse: 91 89  Resp: 16 16  Temp: 37.5 C 37.1 C  SpO2: 100% 100%    Last Pain:  Vitals:   07/31/19 0358  TempSrc: Oral  PainSc:                  Alyna Stensland S
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
## 2019-08-01 ENCOUNTER — Ambulatory Visit
Admit: 2019-08-01 | Discharge: 2019-08-01 | Disposition: A | Discharge status: Home / Self Care | Payer: Medicaid - Out of State | Attending: Radiation Oncology | Admitting: Radiation Oncology

## 2019-08-01 ENCOUNTER — Encounter: Payer: Self-pay | Admitting: Radiation Oncology

## 2019-08-01 ENCOUNTER — Ambulatory Visit
Admit: 2019-08-01 | Discharge: 2019-08-01 | Disposition: A | Discharge status: Home / Self Care | Payer: No Typology Code available for payment source | Attending: Radiation Oncology | Admitting: Radiation Oncology

## 2019-08-01 LAB — BASIC METABOLIC PANEL
Anion gap: 11 (ref 5–15)
BUN: 7 mg/dL (ref 6–20)
CO2: 27 mmol/L (ref 22–32)
Calcium: 8.4 mg/dL — ABNORMAL LOW (ref 8.9–10.3)
Chloride: 98 mmol/L (ref 98–111)
Creatinine, Ser: 0.89 mg/dL (ref 0.61–1.24)
GFR calc Af Amer: >60 mL/min (ref >60)
GFR calc non Af Amer: >60 mL/min (ref >60)
Glucose, Bld: 99 mg/dL (ref 70–99)
Potassium: 3.8 mmol/L (ref 3.5–5.1)
Sodium: 136 mmol/L (ref 135–145)

## 2019-08-01 LAB — CBC
HCT: 21.3 % — ABNORMAL LOW (ref 39.0–52.0)
Hemoglobin: 7.1 g/dL — ABNORMAL LOW (ref 13.0–17.0)
MCH: 27.8 pg (ref 26.0–34.0)
MCHC: 33.3 g/dL (ref 30.0–36.0)
MCV: 83.5 fL (ref 80.0–100.0)
Platelets: 179 10*3/uL (ref 150–400)
RBC: 2.55 MIL/uL — ABNORMAL LOW (ref 4.22–5.81)
RDW: 12.8 % (ref 11.5–15.5)
WBC: 7.6 10*3/uL (ref 4.0–10.5)
nRBC: 0 % (ref 0.0–0.2)

## 2019-08-01 LAB — PHOSPHORUS: Phosphorus: 2.9 mg/dL (ref 2.5–4.6)

## 2019-08-01 LAB — MAGNESIUM: Magnesium: 2 mg/dL (ref 1.7–2.4)

## 2019-08-01 MED ORDER — VITAMIN D 25 MCG (1000 UNIT) PO TABS
2000.0000 [IU] | ORAL_TABLET | Freq: Two times a day (BID) | ORAL | Status: DC
  Administered 2019-08-01 – 2019-08-04 (×7): 2000 [IU] via ORAL
  Filled 2019-08-01 (×7): qty 2

## 2019-08-01 MED ORDER — METHOCARBAMOL 500 MG PO TABS: 500.0000 mg | ORAL_TABLET | Freq: Four times a day (QID) | ORAL | Status: DC | PRN

## 2019-08-01 MED ORDER — VITAMIN D (ERGOCALCIFEROL) 1.25 MG (50000 UNIT) PO CAPS
50000.0000 [IU] | ORAL_CAPSULE | ORAL | Status: DC
  Administered 2019-08-01: 50000 [IU] via ORAL
  Filled 2019-08-01: qty 1

## 2019-08-01 MED ORDER — POLYETHYLENE GLYCOL 3350 17 G PO PACK: 17.0000 g | PACK | Freq: Every day | ORAL | Status: DC

## 2019-08-01 MED ORDER — RIVAROXABAN 10 MG PO TABS
10.0000 mg | ORAL_TABLET | Freq: Every day | ORAL | Status: DC
  Administered 2019-08-01 – 2019-08-02 (×2): 10 mg via ORAL
  Filled 2019-08-01 (×4): qty 1

## 2019-08-01 NOTE — Progress Notes (Signed)
Phoned T, RN on [facility] at [facility] who is caring for this patient. She confirms the patient is alert, oriented x 3, and able to sign consent. She reports there is no family present with him. Explained to T, RN the role of radiation in this setting and that the patient would be returning without any new orders or restrictions. Explained [facility] would be there to pick up the patient in time for an 1115 treatment then transfer him back.   Spoke with patient over T, RN phone. He is agreeable to this plain. Patient concerned about nose ring. This RN explained his nose ring will not have to be removed for this treatment.   Phoned Phil at [facility] at [phone] and made arrangements for transport.   Updated treatment team via email of this plan.

## 2019-08-01 NOTE — Progress Notes (Signed)
Pt has been irritable this shift. Has talked in a hateful tone to me or remains  under covers head to toe and will not speak when spoken too this shift.

## 2019-08-01 NOTE — Progress Notes (Signed)
[facility] Radiation Oncology Dept Therapy Treatment Record Phone [phone]   Radiation Therapy was administered to Raymond Gurney on: 08/01/2019  12:54 PM and was treatment # 1out of a planned course of 1 treatments.  Radiation Treatment  1). Beam photons with 6-10 energy and Photons None  2). Brachytherapy None  3). Stereotactic Radiosurgery None  4). Other Radiation None     Elmo Rio, Kazim Corrales, RT (T)

## 2019-08-01 NOTE — Progress Notes (Signed)
Spoke with Michele Mcalpine at [facility]. Explained radiation therapy is done and patient is ready for transport back to [facility] at [facility] He confirmed understanding.
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
## 2019-08-01 NOTE — Social Work (Signed)
CSW met with pt at bedside. CSW introduced self and explained her role. CSW completed sbirt with pt. Pt scored a 0 on the sbirt scale. Pt denies alcohol use. Pt denies substance use. Pt did not need resources at this time.   Emeterio Reeve, Latanya Presser, [place] Social Worker [phone]

## 2019-08-01 NOTE — Progress Notes (Deleted)
[facility] Radiation Oncology Dept Therapy Treatment Record Phone [phone]   Radiation Therapy was administered to David Peterson on: 08/01/2019  12:51 PM and was treatment # 1 out of a planned course of 1 treatments.  Radiation Treatment  1). Beam photons with 6-10 energy, Electrons 10-19 MeV and Photons 10-19 MeV  2). Brachytherapy None  3). Stereotactic Radiosurgery None  4). Other Radiation None     Sharesa Kemp, Allana Shrestha, RT (T)

## 2019-08-01 NOTE — Progress Notes (Signed)
PT Cancellation Note  Patient Details Name: David Peterson MRN: [id number] DOB: 1991-02-11   Cancelled Treatment:    Reason Eval/Treat Not Completed: (P) Patient declined, no reason specified(Pt lying in bed appears to be sleeping.  Woke patient & informed him that we were here to do PT.  He immediately became agitated and reports he was up 30 min ago.  He refused PT despite education & encouragement.  Will f/u pending patient participation.)   Florestine Avers 08/01/2019, 3:50 PM  Bonney Leitz , PTA Acute Rehabilitation Services Pager [phone] Office [phone]

## 2019-08-01 NOTE — Plan of Care (Signed)
  Problem: Education: Goal: Knowledge of General Education information will improve Description: Including pain rating scale, medication(s)/side effects and non-pharmacologic comfort measures Outcome: Progressing   Problem: Clinical Measurements: Goal: Ability to maintain clinical measurements within normal limits will improve Outcome: Progressing Goal: Will remain free from infection Outcome: Progressing Goal: Diagnostic test results will improve Outcome: Progressing Goal: Respiratory complications will improve Outcome: Progressing Goal: Cardiovascular complication will be avoided Outcome: Progressing   Problem: Coping: Goal: Level of anxiety will decrease Outcome: Progressing   Problem: Elimination: Goal: Will not experience complications related to bowel motility Outcome: Progressing Goal: Will not experience complications related to urinary retention Outcome: Progressing   Problem: Pain Managment: Goal: General experience of comfort will improve Outcome: Progressing   Problem: Safety: Goal: Ability to remain free from injury will improve Outcome: Progressing   Problem: Skin Integrity: Goal: Risk for impaired skin integrity will decrease Outcome: Progressing   

## 2019-08-01 NOTE — Progress Notes (Signed)
Received patient from [facility] via [facility] in the clinic at approximately 1117. Patient's vitals are stable and documented. Patient denies any needs at this time. No distress noted. Call bell within reach. Patient understands he is waiting for Dr. Basilio Cairo now to discuss procedure and obtain consent.   At 1152 therapist wheeled patient from clinic room 1 back to treatment machine. Patient alert, oriented x3, and in no distress. Therapist with consent for treatment in hand.
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
## 2019-08-01 NOTE — Consult Note (Signed)
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 With subluxation markedly comminuted distal right or dislocation tibial fracture at the tibiotalar joint. Electronically Signed   By: Rolm Baptise M.D.   On: 07/28/2019 19:34   DG C-Arm 1-60 Min  Result Date: 07/29/2019 CLINICAL DATA:  ORIF right acetabular fracture EXAM: JUDET PELVIS - 3+ VIEW; DG C-ARM 1-60 MIN COMPARISON:  Pelvic radiograph dated 07/28/2019 FLUOROSCOPY TIME:  3 minutes 1 second FINDINGS: Intraoperative fluoroscopic images during ORIF of a right acetabular fracture and placement of a right sacroiliac screw. IMPRESSION: Intraoperative fluoroscopic images as above. Electronically Signed   By: Julian Hy M.D.   On: 07/29/2019 21:33   DG Hip Port Unilat W or Wo Pelvis 1 View Right  Result Date: 07/28/2019 CLINICAL DATA:  Postreduction right hip EXAM: DG HIP (WITH OR WITHOUT PELVIS) 1V PORT RIGHT COMPARISON:  07/28/2019 FINDINGS: Interval reduction of the previously seen dislocated right hip. The joint space is wider on the right than the left suggesting subluxation or possible joint effusion. Acetabular fracture again noted, with slight increased displacement. IMPRESSION: Interval reduction of the dislocated right hip with widening of the joint space related to subluxation and/or joint  effusion. Increased displacement of the right acetabular fracture. Electronically Signed   By: Rolm Baptise M.D.   On: 07/28/2019 19:33      IMPRESSION/PLAN: 29 yo gentleman status post internal fixation for right acetabular fracture.  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  Today, I talked to the patient about his condition and general treatment for this, highlighting the role of radiotherapy in the management. We discussed the available radiation techniques, and focused on the details of logistics and delivery.    We discussed the risks, benefits, and side effects of radiotherapy. Side effects may include but not necessarily be limited to: Skin irritation, fatigue, injury to issues in the treated field.  He understands there is any extremely small risk of secondary malignancy.  There is a risk of transient decrease in sperm count but we will displace his genitalia as far as possible outside of the radiation fields. No guarantees of treatment were given. A consent form was signed and placed in the patient's medical record. The patient was encouraged to ask questions that I  answered to the best of my ability.   We will proceed with treatment planning and delivery of treatment today.  Patient will receive 7 Gy in 1 fraction to the right hip.   On date of service, in total, I spent 25 minutes on this encounter.  Patient was seen in person. __________________________________________   Lonie Peak, MD

## 2019-08-02 ENCOUNTER — Encounter: Payer: Self-pay | Admitting: *Deleted

## 2019-08-02 LAB — BASIC METABOLIC PANEL
Anion gap: 3 — ABNORMAL LOW (ref 5–15)
BUN: 12 mg/dL (ref 6–20)
CO2: 30 mmol/L (ref 22–32)
Calcium: 8.4 mg/dL — ABNORMAL LOW (ref 8.9–10.3)
Chloride: 99 mmol/L (ref 98–111)
Creatinine, Ser: 0.78 mg/dL (ref 0.61–1.24)
GFR calc Af Amer: >60 mL/min (ref >60)
GFR calc non Af Amer: >60 mL/min (ref >60)
Glucose, Bld: 109 mg/dL — ABNORMAL HIGH (ref 70–99)
Potassium: 3.9 mmol/L (ref 3.5–5.1)
Sodium: 132 mmol/L — ABNORMAL LOW (ref 135–145)

## 2019-08-02 LAB — CBC
HCT: 23.2 % — ABNORMAL LOW (ref 39.0–52.0)
Hemoglobin: 7.6 g/dL — ABNORMAL LOW (ref 13.0–17.0)
MCH: 27.4 pg (ref 26.0–34.0)
MCHC: 32.8 g/dL (ref 30.0–36.0)
MCV: 83.8 fL (ref 80.0–100.0)
Platelets: 238 10*3/uL (ref 150–400)
RBC: 2.77 MIL/uL — ABNORMAL LOW (ref 4.22–5.81)
RDW: 12.6 % (ref 11.5–15.5)
WBC: 7.7 10*3/uL (ref 4.0–10.5)
nRBC: 0.3 % — ABNORMAL HIGH (ref 0.0–0.2)

## 2019-08-02 MED ORDER — METHOCARBAMOL 500 MG PO TABS
500.0000 mg | ORAL_TABLET | Freq: Three times a day (TID) | ORAL | Status: DC
  Administered 2019-08-02 – 2019-08-04 (×6): 500 mg via ORAL
  Filled 2019-08-02 (×11): qty 1

## 2019-08-02 MED ORDER — GABAPENTIN 300 MG PO CAPS
300.0000 mg | ORAL_CAPSULE | Freq: Three times a day (TID) | ORAL | Status: DC
  Administered 2019-08-02 – 2019-08-04 (×6): 300 mg via ORAL
  Filled 2019-08-02 (×7): qty 1

## 2019-08-02 MED ORDER — POLYETHYLENE GLYCOL 3350 17 G PO PACK
17.0000 g | PACK | Freq: Two times a day (BID) | ORAL | Status: DC
  Filled 2019-08-02 (×4): qty 1
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
## 2019-08-02 NOTE — Progress Notes (Addendum)
PT called to coordinate treatment with this patient, offered pain med and muscle relaxant (as scheduled) to patient, patiet refusing meds at this time.
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
## 2019-08-02 NOTE — Progress Notes (Signed)
Called by PT, patient is nauseous ,vomited small amount, requesting for nausea meds. Upon arrival to the room, PT working with patient, when asked to administer nausea med, patient refused.

## 2019-08-03 ENCOUNTER — Encounter: Payer: Self-pay | Admitting: Radiation Oncology

## 2019-08-03 MED ORDER — BISACODYL 10 MG RE SUPP: 10.0000 mg | Freq: Every day | RECTAL | Status: DC | PRN
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
## 2019-08-03 NOTE — Progress Notes (Signed)
Inpatient Rehab Admissions:  Inpatient Rehab Consult received.  I met with pt at the bedside for rehabilitation assessment. Pt appears to be a good candidate for rehab and does show  interest in a program.  Pt reported he has Medicaid. Verified online that pt does not have active Medicaid at this time. Due to potential cost of care, pt has chosen not to pursue inpatient rehab.  Pt also unclear about his dispo at this time.  D/t unconfirmed d/c plan and pt reference, AC will sign off. TOC team aware.  Signed: Gayland Curry, [place], [place] Admissions Coordinator [phone]

## 2019-08-04 MED ORDER — METHOCARBAMOL 500 MG PO TABS: 500.0000 mg | ORAL_TABLET | Freq: Four times a day (QID) | ORAL | 0 refills | Status: DC | PRN

## 2019-08-04 MED ORDER — OXYCODONE HCL 10 MG PO TABS: 10.0000 mg | ORAL_TABLET | Freq: Four times a day (QID) | ORAL | 0 refills | Status: DC | PRN

## 2019-08-04 MED ORDER — VITAMIN D (ERGOCALCIFEROL) 1.25 MG (50000 UNIT) PO CAPS: 50000.0000 [IU] | ORAL_CAPSULE | ORAL | 0 refills | Status: AC

## 2019-08-04 MED ORDER — ASPIRIN EC 325 MG PO TBEC: 325.0000 mg | DELAYED_RELEASE_TABLET | Freq: Two times a day (BID) | ORAL | 1 refills | Status: AC

## 2019-08-04 MED ORDER — ACETAMINOPHEN 500 MG PO TABS: 1000.0000 mg | ORAL_TABLET | Freq: Four times a day (QID) | ORAL | 0 refills | Status: AC | PRN

## 2019-08-04 MED ORDER — POLYETHYLENE GLYCOL 3350 17 G PO PACK: 17.0000 g | PACK | Freq: Two times a day (BID) | ORAL | 0 refills | Status: AC | PRN

## 2019-08-04 MED ORDER — VITAMIN D3 25 MCG PO TABS: 2000.0000 [IU] | ORAL_TABLET | Freq: Two times a day (BID) | ORAL | 0 refills | Status: AC

## 2019-08-04 MED ORDER — GABAPENTIN 300 MG PO CAPS: 300.0000 mg | ORAL_CAPSULE | Freq: Three times a day (TID) | ORAL | 0 refills | Status: DC | PRN

## 2019-08-04 MED ORDER — ONDANSETRON HCL 4 MG PO TABS: 4.0000 mg | ORAL_TABLET | Freq: Four times a day (QID) | ORAL | 0 refills | Status: AC | PRN

## 2019-08-04 MED ORDER — DOCUSATE SODIUM 100 MG PO CAPS: 100.0000 mg | ORAL_CAPSULE | Freq: Two times a day (BID) | ORAL | 0 refills | Status: AC | PRN

## 2019-08-04 MED ORDER — RIVAROXABAN 10 MG PO TABS: 10.0000 mg | ORAL_TABLET | Freq: Every day | ORAL | 0 refills | Status: DC

## 2019-08-04 MED FILL — METHOCARBAMOL 500 MG TABS: 500 | 10 days supply | Qty: 40 | Fill #0

## 2019-08-04 MED FILL — oxyCODONE HCL 10 MG TABS: 10 | 5 days supply | Qty: 20 | Fill #0

## 2019-08-04 MED FILL — VITAMIN D3 25 MCG TABS: 25 | 15 days supply | Qty: 60 | Fill #0

## 2019-08-04 MED FILL — ASPIRIN EC 325 MG TABLET: 325 | 60 days supply | Qty: 120 | Fill #0

## 2019-08-04 MED FILL — ONDANSETRON HCL 4 MG TABLET: 4 | 5 days supply | Qty: 20 | Fill #0

## 2019-08-04 MED FILL — VIT D2 1.25 MG (50,000 UNIT: 1.25 MG | 30 days supply | Qty: 5 | Fill #0

## 2019-08-04 MED FILL — GABAPENTIN 300 MG CAPSULE: 300 | 20 days supply | Qty: 60 | Fill #0
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
## 2019-08-04 NOTE — Discharge Instructions (Signed)
Please remain non-weight bearing to your right lower extremity.  Please follow up with your orthopedist   How to Care for an External Fixator  An external fixator is a device that holds a broken bone in a stable (fixed) position until it heals. This device is often used on the arms, legs, pelvis, or neck. It is attached to the bones with screws that are called pins or bolts. The pins are drilled into the bone on each side of the break and are attached to a metal or carbon fiber rod with nuts. When the bone is fully healed, the external fixator is removed. The type of external fixator that you have will depend on which bone is broken. Most people need an external fixator for 6-12 weeks. Sometimes, it is needed for up to 1 year for fractures that heal slowly or fractures that require slow alteration in alignment. What are the risks? Generally, external fixators are safe to wear. However, problems may occur, including infection at the pin sites. Supplies needed:  Pin-cleaning solution.  Sterile cotton swabs.  Sterile split gauze.  Square gauze.  Clean washcloth.  Towel. How to clean your pin sites Clean the pin sites two times a day or as often as told by your health care provider. Pin sites are the spots where the pins go through your skin. There are various types of pin-cleaning solutions. Use the solution that is recommended by your health care provider. 1. Wash your hands with soap and water. 2. Remove the old split gauze from each pin and discard it in a trash bin. 3. Wash your hands again with soap and water. 4. Pour the cleaning solution in a sterile container. 5. Dip a cotton swab in the solution. Swab around the base of the pin, where it meets your skin. Use a circular motion. If your skin has moved up onto the pin, gently push it back down. Remove any crusts that have formed on the pin. After you have swabbed the base of the pin, clean the rest of the pin. 6. Use a new swab for  each pin. 7. Dry each pin site with a clean cotton swab. 8. If directed by your health care provider, apply an antibiotic ointment. 9. Place a new split gauze on each pin. Check the pin sites every day for signs of infection. Check for:  Redness, swelling, or pain.  Fluid or blood.  Warmth.  Pus or a bad smell. How to care for your external fixator Different types of external fixators will have different instructions for care. Your health care provider will tell you how to care for the type that you have. In general, you should:  Examine your external fixator every day. ? Check for any loose pins. One sign of looseness is pain at the site where the pin exits the skin or pain at the site of the break. ? Make sure the nuts are tight. Your health care provider may show you how to tighten the nuts. However, do not make any adjustments to your external fixator unless told to do so by your health care provider.  Clean the frame of the fixator two times a week or as often as told by your health care provider. 1. Use a square gauze or a clean washcloth that is moistened with rubbing alcohol and water to clean the frame. 2. After you clean the frame, wipe it dry with a towel. 3. Use a new gauze square or clean washcloth, and a  clean towel for each cleaning. Follow these instructions at home: Bathing  Do not take baths, swim, or use a hot tub until your health care provider approves. Ask your health care provider if you may take showers. You may only be allowed to take sponge baths.  When you are allowed to shower, ask your health care provider if you need to cover your external fixator with a waterproof covering. General instructions  Take over-the-counter and prescription medicines only as told by your health care provider.  Return to your normal activities as told by your health care provider. Ask your health care provider what activities are safe for you.  Keep all follow-up visits as  told by your health care provider. This is important. Contact a health care provider if:  You have redness, swelling, or pain at any pin site.  You have fluid or blood coming from a pin site.  A pin site feels warm to the touch.  You have pus or a bad smell coming from a pin site.  You have a fever. Get help right away if:  A pin moves or becomes loose.  You have increasing pain where your bone was broken. Summary  An external fixator is a device that holds a broken bone in a stable (fixed) position until it heals.  Most people need an external fixator for 6-12 weeks.  Your health care provider will tell you how to care for the type of external fixator that you have.  When the bone is fully healed, the external fixator is removed. This information is not intended to replace advice given to you by your health care provider. Make sure you discuss any questions you have with your health care provider. Document Revised: 06/04/2018 Document Reviewed: 06/07/2018 Elsevier Patient Education  2020 Elsevier Inc.  RIB FRACTURES  HOME INSTRUCTIONS   1. PAIN CONTROL:  1. Pain is best controlled by a usual combination of three different methods TOGETHER:  i. Ice/Heat ii. Over the counter pain medication iii. Prescription pain medication 2. You may experience some swelling and bruising in area of broken ribs. Ice packs or heating pads (30-60 minutes up to 6 times a day) will help. Use ice for the first few days to help decrease swelling and bruising, then switch to heat to help relax tight/sore spots and speed recovery. Some people prefer to use ice alone, heat alone, alternating between ice & heat. Experiment to what works for you. Swelling and bruising can take several weeks to resolve.  3. It is helpful to take an over-the-counter pain medication regularly for the first few weeks. Choose one of the following that works best for you:  i. Naproxen (Aleve, etc) Two 220mg  tabs twice a  day ii. Ibuprofen (Advil, etc) Three 200mg  tabs four times a day (every meal & bedtime) iii. Acetaminophen (Tylenol, etc) 500-650mg  four times a day (every meal & bedtime) 4. A prescription for pain medication (such as oxycodone, hydrocodone, etc) may be given to you upon discharge. Take your pain medication as prescribed.  i. If you are having problems/concerns with the prescription medicine (does not control pain, nausea, vomiting, rash, itching, etc), please call us [phone] to see if we need to switch you to a different pain medicine that will work better for you and/or control your side effect better. ii. If you need a refill on your pain medication, please contact your pharmacy. They will contact our office to request authorization. Prescriptions will not be filled after 5  pm or on week-ends. 1. Avoid getting constipated. When taking pain medications, it is common to experience some constipation. Increasing fluid intake and taking a fiber supplement (such as Metamucil, Citrucel, FiberCon, MiraLax, etc) 1-2 times a day regularly will usually help prevent this problem from occurring. A mild laxative (prune juice, Milk of Magnesia, MiraLax, etc) should be taken according to package directions if there are no bowel movements after 48 hours.  2. Watch out for diarrhea. If you have many loose bowel movements, simplify your diet to bland foods & liquids for a few days. Stop any stool softeners and decrease your fiber supplement. Switching to mild anti-diarrheal medications (Kayopectate, Pepto Bismol) can help. If this worsens or does not improve, please call us. 3. FOLLOW UP  a. If a follow up appointment is needed one will be scheduled for you. If none is needed with our trauma team, please follow up with your primary care provider within 2-3 weeks from discharge. Please call [facility] at [phone] if you have any questions about follow up.  b. If you have any orthopedic or other injuries you will  need to follow up as outlined in your follow up instructions.   WHEN TO CALL us [phone]:  1. Poor pain control 2. Reactions / problems with new medications (rash/itching, nausea, etc)  3. Fever over 101.5 F (38.5 C) 4. Worsening swelling or bruising 5. Worsening pain, productive cough, difficulty breathing or any other concerning symptoms  The clinic staff is available to answer your questions during regular business hours (8:30am-5pm). Please don't hesitate to call and ask to speak to one of our nurses for clinical concerns.  If you have a medical emergency, go to the nearest emergency room or call 911.  A surgeon from [facility] is always on call at the [facility], [place]  [address], [address], [place], [place] [address] ?  MAIN: [phone] ? TOLL FREE: [phone] ?  FAX [phone]  [email or web address]      Information on Rib Fractures  A rib fracture is a break or crack in one of the bones of the ribs. The ribs are long, curved bones that wrap around your chest and attach to your spine and your breastbone. The ribs protect your heart, lungs, and other organs in the chest. A broken or cracked rib is often painful but is not usually serious. Most rib fractures heal on their own over time. However, rib fractures can be more serious if multiple ribs are broken or if broken ribs move out of place and push against other structures or organs. What are the causes? This condition is caused by:  Repetitive movements with high force, such as pitching a baseball or having severe coughing spells.  A direct blow to the chest, such as a sports injury, a car accident, or a fall.  Cancer that has spread to the bones, which can weaken bones and cause them to break. What are the signs or symptoms? Symptoms of this condition include:  Pain when you breathe in or cough.  Pain when someone presses on the injured  area.  Feeling short of breath. How is this diagnosed? This condition is diagnosed with a physical exam and medical history. Imaging tests may also be done, such as:  Chest X-ray.  CT scan.  MRI.  Bone scan.  Chest ultrasound. How is this treated? Treatment for this condition depends on the severity of the fracture. Most rib  fractures usually heal on their own in 1-3 months. Sometimes healing takes longer if there is a cough that does not stop or if there are other activities that make the injury worse (aggravating factors). While you heal, you will be given medicines to control the pain. You will also be taught deep breathing exercises. Severe injuries may require hospitalization or surgery. Follow these instructions at home: Managing pain, stiffness, and swelling  If directed, apply ice to the injured area. ? Put ice in a plastic bag. ? Place a towel between your skin and the bag. ? Leave the ice on for 20 minutes, 2-3 times a day.  Take over-the-counter and prescription medicines only as told by your health care provider. Activity  Avoid a lot of activity and any activities or movements that cause pain. Be careful during activities and avoid bumping the injured rib.  Slowly increase your activity as told by your health care provider. General instructions  Do deep breathing exercises as told by your health care provider. This helps prevent pneumonia, which is a common complication of a broken rib. Your health care provider may instruct you to: ? Take deep breaths several times a day. ? Try to cough several times a day, holding a pillow against the injured area. ? Use a device called incentive spirometer to practice deep breathing several times a day.  Drink enough fluid to keep your urine pale yellow.  Do not wear a rib belt or binder. These restrict breathing, which can lead to pneumonia.  Keep all follow-up visits as told by your health care provider. This is  important. Contact a health care provider if:  You have a fever. Get help right away if:  You have difficulty breathing or you are short of breath.  You develop a cough that does not stop, or you cough up thick or bloody sputum.  You have nausea, vomiting, or pain in your abdomen.  Your pain gets worse and medicine does not help. Summary  A rib fracture is a break or crack in one of the bones of the ribs.  A broken or cracked rib is often painful but is not usually serious.  Most rib fractures heal on their own over time.  Treatment for this condition depends on the severity of the fracture.  Avoid a lot of activity and any activities or movements that cause pain. This information is not intended to replace advice given to you by your health care provider. Make sure you discuss any questions you have with your health care provider. Document Released: 04/07/2005 Document Revised: 07/07/2016 Document Reviewed: 07/07/2016 Elsevier Interactive Patient Education  2019 Elsevier Inc.  Rivaroxaban oral tablets What is this medicine? RIVAROXABAN (ri va ROX a ban) is an anticoagulant (blood thinner). It is used to treat blood clots in the lungs or in the veins. It is also used to prevent blood clots in the lungs or in the veins. It is also used to lower the chance of stroke in people with a medical condition called atrial fibrillation. This medicine may be used for other purposes; ask your health care provider or pharmacist if you have questions. COMMON BRAND NAME(S): Xarelto, Xarelto Starter Pack What should I tell my health care provider before I take this medicine? They need to know if you have any of these conditions:  antiphospholipid antibody syndrome  artificial heart valve  bleeding disorders  bleeding in the brain  blood in your stools (black or tarry stools) or if you  have blood in your vomit  history of blood clots  history of stomach bleeding  kidney  disease  liver disease  low blood counts, like low white cell, platelet, or red cell counts  recent or planned spinal or epidural procedure  take medicines that treat or prevent blood clots  an unusual or allergic reaction to rivaroxaban, other medicines, foods, dyes, or preservatives  pregnant or trying to get pregnant  breast-feeding How should I use this medicine? Take this medicine by mouth with a glass of water. Follow the directions on the prescription label. Take your medicine at regular intervals. Do not take it more often than directed. Do not stop taking except on your doctor's advice. Stopping this medicine may increase your risk of a blood clot. Be sure to refill your prescription before you run out of medicine. If you are taking this medicine after hip or knee replacement surgery, take it with or without food. If you are taking this medicine for atrial fibrillation, take it with your evening meal. If you are taking this medicine to treat blood clots, take it with food at the same time each day. If you are unable to swallow your tablet, you may crush the tablet and mix it in applesauce. Then, immediately eat the applesauce. You should eat more food right after you eat the applesauce containing the crushed tablet. Talk to your pediatrician regarding the use of this medicine in children. Special care may be needed. Overdosage: If you think you have taken too much of this medicine contact a poison control center or emergency room at once. NOTE: This medicine is only for you. Do not share this medicine with others. What if I miss a dose? If you take your medicine once a day and miss a dose, take the missed dose as soon as you remember. If it is almost time for your next dose, take only that dose. Do not take double or extra doses. If you take your medicine twice a day and miss a dose, take the missed dose immediately. In this instance, 2 tablets may be taken at the same time. The next  day you should take 1 tablet twice a day as directed. What may interact with this medicine? Do not take this medicine with any of the following medications:  defibrotide This medicine may also interact with the following medications:  aspirin and aspirin-like medicines  certain antibiotics like erythromycin, azithromycin, and clarithromycin  certain medicines for fungal infections like ketoconazole and itraconazole  certain medicines for irregular heart beat like amiodarone, quinidine, dronedarone  certain medicines for seizures like carbamazepine, phenytoin  certain medicines that treat or prevent blood clots like warfarin, enoxaparin, and dalteparin  conivaptan  felodipine  indinavir  lopinavir; ritonavir  NSAIDS, medicines for pain and inflammation, like ibuprofen or naproxen  ranolazine  rifampin  ritonavir  SNRIs, medicines for depression, like desvenlafaxine, duloxetine, levomilnacipran, venlafaxine  SSRIs, medicines for depression, like citalopram, escitalopram, fluoxetine, fluvoxamine, paroxetine, sertraline  St. John's wort  verapamil This list may not describe all possible interactions. Give your health care provider a list of all the medicines, herbs, non-prescription drugs, or dietary supplements you use. Also tell them if you smoke, drink alcohol, or use illegal drugs. Some items may interact with your medicine. What should I watch for while using this medicine? Visit your healthcare professional for regular checks on your progress. You may need blood work done while you are taking this medicine. Your condition will be monitored carefully while  you are receiving this medicine. It is important not to miss any appointments. Avoid sports and activities that might cause injury while you are using this medicine. Severe falls or injuries can cause unseen bleeding. Be careful when using sharp tools or knives. Consider using an Neurosurgeon. Take special care  brushing or flossing your teeth. Report any injuries, bruising, or red spots on the skin to your healthcare professional. If you are going to need surgery or other procedure, tell your healthcare professional that you are taking this medicine. Wear a medical ID bracelet or chain. Carry a card that describes your disease and details of your medicine and dosage times. What side effects may I notice from receiving this medicine? Side effects that you should report to your doctor or health care professional as soon as possible:  allergic reactions like skin rash, itching or hives, swelling of the face, lips, or tongue  back pain  redness, blistering, peeling or loosening of the skin, including inside the mouth  signs and symptoms of bleeding such as bloody or black, tarry stools; red or dark-brown urine; spitting up blood or brown material that looks like coffee grounds; red spots on the skin; unusual bruising or bleeding from the eye, gums, or nose  signs and symptoms of a blood clot such as chest pain; shortness of breath; pain, swelling, or warmth in the leg  signs and symptoms of a stroke such as changes in vision; confusion; trouble speaking or understanding; severe headaches; sudden numbness or weakness of the face, arm or leg; trouble walking; dizziness; loss of coordination Side effects that usually do not require medical attention (report to your doctor or health care professional if they continue or are bothersome):  dizziness  muscle pain This list may not describe all possible side effects. Call your doctor for medical advice about side effects. You may report side effects to FDA at 1-800-FDA-1088. Where should I keep my medicine? Keep out of the reach of children. Store at room temperature between 15 and 30 degrees C (59 and 86 degrees F). Throw away any unused medicine after the expiration date. NOTE: This sheet is a summary. It may not cover all possible information. If you have  questions about this medicine, talk to your doctor, pharmacist, or health care provider.  2020 Elsevier/Gold Standard (2018-07-05 09:45:59)
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
## 2019-08-10 ENCOUNTER — Ambulatory Visit: Payer: Self-pay | Admitting: Student

## 2019-08-10 DIAGNOSIS — S82871A Displaced pilon fracture of right tibia, initial encounter for closed fracture: Secondary | ICD-10-CM | POA: Insufficient documentation

## 2019-08-12 NOTE — Progress Notes (Signed)
Have spoken to patient and provided him information regarding time and place to arrive for surgery on Monday 08/15/19. Instructed patient nothing to eat or drink after midnight. Phone number to reach patient is [phone]. Alternate phone number is [phone]
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
## 2019-08-15 ENCOUNTER — Ambulatory Visit (HOSPITAL_COMMUNITY): Payer: Medicaid - Out of State

## 2019-08-15 ENCOUNTER — Ambulatory Visit (HOSPITAL_COMMUNITY): Payer: Medicaid - Out of State | Admitting: Anesthesiology

## 2019-08-15 ENCOUNTER — Encounter (HOSPITAL_COMMUNITY): Payer: Self-pay | Admitting: Student

## 2019-08-15 ENCOUNTER — Observation Stay (HOSPITAL_COMMUNITY): Payer: Medicaid - Out of State

## 2019-08-15 ENCOUNTER — Observation Stay (HOSPITAL_COMMUNITY)
Admission: RE | Admit: 2019-08-15 | Discharge: 2019-08-18 | Disposition: A | Discharge status: Home / Self Care | Payer: Medicaid - Out of State | Attending: Student | Admitting: Student

## 2019-08-15 ENCOUNTER — Encounter (HOSPITAL_COMMUNITY)
Admission: RE | Disposition: A | Discharge status: Home / Self Care | Payer: Self-pay | Source: Home / Self Care | Attending: Student

## 2019-08-15 ENCOUNTER — Other Ambulatory Visit: Payer: Self-pay

## 2019-08-15 DIAGNOSIS — Z880 Allergy status to penicillin: Secondary | ICD-10-CM | POA: Insufficient documentation

## 2019-08-15 DIAGNOSIS — Z419 Encounter for procedure for purposes other than remedying health state, unspecified: Secondary | ICD-10-CM

## 2019-08-15 DIAGNOSIS — S82871A Displaced pilon fracture of right tibia, initial encounter for closed fracture: Secondary | ICD-10-CM | POA: Diagnosis present

## 2019-08-15 DIAGNOSIS — F17200 Nicotine dependence, unspecified, uncomplicated: Secondary | ICD-10-CM | POA: Diagnosis not present

## 2019-08-15 DIAGNOSIS — S32409A Unspecified fracture of unspecified acetabulum, initial encounter for closed fracture: Secondary | ICD-10-CM

## 2019-08-15 DIAGNOSIS — Y92410 Unspecified street and highway as the place of occurrence of the external cause: Secondary | ICD-10-CM | POA: Diagnosis not present

## 2019-08-15 DIAGNOSIS — Z20822 Contact with and (suspected) exposure to covid-19: Secondary | ICD-10-CM | POA: Diagnosis not present

## 2019-08-15 DIAGNOSIS — T148XXA Other injury of unspecified body region, initial encounter: Secondary | ICD-10-CM

## 2019-08-15 DIAGNOSIS — S93431A Sprain of tibiofibular ligament of right ankle, initial encounter: Secondary | ICD-10-CM | POA: Insufficient documentation

## 2019-08-15 DIAGNOSIS — S96811A Strain of other specified muscles and tendons at ankle and foot level, right foot, initial encounter: Secondary | ICD-10-CM | POA: Insufficient documentation

## 2019-08-15 HISTORY — PX: OPEN REDUCTION INTERNAL FIXATION (ORIF) TIBIA/FIBULA FRACTURE: SHX5992

## 2019-08-15 LAB — RESPIRATORY PANEL BY RT PCR (FLU A&B, COVID)
Influenza A by PCR: NEGATIVE
Influenza B by PCR: NEGATIVE
SARS Coronavirus 2 by RT PCR: NEGATIVE

## 2019-08-15 SURGERY — OPEN REDUCTION INTERNAL FIXATION (ORIF) TIBIA/FIBULA FRACTURE
Anesthesia: General | Site: Leg Lower | Laterality: Right

## 2019-08-15 MED ORDER — METHOCARBAMOL 1000 MG/10ML IJ SOLN
500.0000 mg | Freq: Four times a day (QID) | INTRAVENOUS | Status: DC | PRN
  Filled 2019-08-15: qty 5

## 2019-08-15 MED ORDER — FENTANYL CITRATE (PF) 100 MCG/2ML IJ SOLN
INTRAMUSCULAR | Status: AC
  Administered 2019-08-15: 50 ug via INTRAVENOUS
  Filled 2019-08-15: qty 2

## 2019-08-15 MED ORDER — ONDANSETRON HCL 4 MG/2ML IJ SOLN
INTRAMUSCULAR | Status: AC
  Filled 2019-08-15: qty 2

## 2019-08-15 MED ORDER — METOCLOPRAMIDE HCL 5 MG PO TABS: 5.0000 mg | ORAL_TABLET | Freq: Three times a day (TID) | ORAL | Status: DC | PRN

## 2019-08-15 MED ORDER — ACETAMINOPHEN 160 MG/5ML PO SOLN: 325.0000 mg | Freq: Once | ORAL | Status: DC | PRN

## 2019-08-15 MED ORDER — METHOCARBAMOL 500 MG PO TABS
500.0000 mg | ORAL_TABLET | Freq: Four times a day (QID) | ORAL | Status: DC | PRN
  Administered 2019-08-16 – 2019-08-18 (×4): 500 mg via ORAL
  Filled 2019-08-15 (×5): qty 1

## 2019-08-15 MED ORDER — CEFAZOLIN SODIUM-DEXTROSE 2-4 GM/100ML-% IV SOLN
2.0000 g | Freq: Three times a day (TID) | INTRAVENOUS | Status: AC
  Administered 2019-08-15 – 2019-08-16 (×3): 2 g via INTRAVENOUS
  Filled 2019-08-15 (×3): qty 100

## 2019-08-15 MED ORDER — ACETAMINOPHEN 10 MG/ML IV SOLN: 1000.0000 mg | Freq: Once | INTRAVENOUS | Status: DC | PRN

## 2019-08-15 MED ORDER — ONDANSETRON HCL 4 MG/2ML IJ SOLN: 4.0000 mg | Freq: Four times a day (QID) | INTRAMUSCULAR | Status: DC | PRN

## 2019-08-15 MED ORDER — FENTANYL CITRATE (PF) 100 MCG/2ML IJ SOLN: 50.0000 ug | Freq: Once | INTRAMUSCULAR | Status: AC

## 2019-08-15 MED ORDER — ROCURONIUM BROMIDE 10 MG/ML (PF) SYRINGE
PREFILLED_SYRINGE | INTRAVENOUS | Status: AC
  Filled 2019-08-15: qty 10

## 2019-08-15 MED ORDER — MEPERIDINE HCL 25 MG/ML IJ SOLN: 6.2500 mg | INTRAMUSCULAR | Status: DC | PRN

## 2019-08-15 MED ORDER — PROPOFOL 10 MG/ML IV BOLUS
INTRAVENOUS | Status: DC | PRN
  Administered 2019-08-15: 200 mg via INTRAVENOUS

## 2019-08-15 MED ORDER — GABAPENTIN 100 MG PO CAPS
100.0000 mg | ORAL_CAPSULE | Freq: Three times a day (TID) | ORAL | Status: DC
  Administered 2019-08-15 (×2): 100 mg via ORAL
  Filled 2019-08-15 (×2): qty 1

## 2019-08-15 MED ORDER — HYDROMORPHONE HCL 1 MG/ML IJ SOLN: 0.2500 mg | INTRAMUSCULAR | Status: DC | PRN

## 2019-08-15 MED ORDER — 0.9 % SODIUM CHLORIDE (POUR BTL) OPTIME
TOPICAL | Status: DC | PRN
  Administered 2019-08-15: 1000 mL

## 2019-08-15 MED ORDER — MIDAZOLAM HCL 2 MG/2ML IJ SOLN: 2.0000 mg | Freq: Once | INTRAMUSCULAR | Status: AC

## 2019-08-15 MED ORDER — LACTATED RINGERS IV SOLN: INTRAVENOUS | Status: DC

## 2019-08-15 MED ORDER — POLYETHYLENE GLYCOL 3350 17 G PO PACK
17.0000 g | PACK | Freq: Every day | ORAL | Status: DC | PRN
  Administered 2019-08-17: 17 g via ORAL
  Filled 2019-08-15 (×2): qty 1

## 2019-08-15 MED ORDER — LIDOCAINE 2% (20 MG/ML) 5 ML SYRINGE
INTRAMUSCULAR | Status: AC
  Filled 2019-08-15: qty 5

## 2019-08-15 MED ORDER — BUPIVACAINE-EPINEPHRINE (PF) 0.5% -1:200000 IJ SOLN
INTRAMUSCULAR | Status: DC | PRN
  Administered 2019-08-15: 30 mL via PERINEURAL
  Administered 2019-08-15: 20 mL via PERINEURAL

## 2019-08-15 MED ORDER — HYDROMORPHONE HCL 1 MG/ML IJ SOLN: 0.5000 mg | INTRAMUSCULAR | Status: DC | PRN

## 2019-08-15 MED ORDER — OXYCODONE HCL 5 MG PO TABS
10.0000 mg | ORAL_TABLET | ORAL | Status: DC | PRN
  Administered 2019-08-15: 18:00:00 10 mg via ORAL
  Administered 2019-08-16 – 2019-08-18 (×11): 15 mg via ORAL
  Filled 2019-08-15 (×3): qty 3
  Filled 2019-08-15: qty 2
  Filled 2019-08-15 (×9): qty 3

## 2019-08-15 MED ORDER — FENTANYL CITRATE (PF) 250 MCG/5ML IJ SOLN
INTRAMUSCULAR | Status: AC
  Filled 2019-08-15: qty 5

## 2019-08-15 MED ORDER — METOCLOPRAMIDE HCL 5 MG/ML IJ SOLN: 5.0000 mg | Freq: Three times a day (TID) | INTRAMUSCULAR | Status: DC | PRN

## 2019-08-15 MED ORDER — DOCUSATE SODIUM 100 MG PO CAPS
100.0000 mg | ORAL_CAPSULE | Freq: Two times a day (BID) | ORAL | Status: DC
  Administered 2019-08-15 – 2019-08-18 (×5): 100 mg via ORAL
  Filled 2019-08-15 (×6): qty 1

## 2019-08-15 MED ORDER — DEXAMETHASONE SODIUM PHOSPHATE 10 MG/ML IJ SOLN
INTRAMUSCULAR | Status: AC
  Filled 2019-08-15: qty 1

## 2019-08-15 MED ORDER — CEFAZOLIN SODIUM-DEXTROSE 2-4 GM/100ML-% IV SOLN
2.0000 g | INTRAVENOUS | Status: AC
  Administered 2019-08-15: 2 g via INTRAVENOUS
  Filled 2019-08-15: qty 100

## 2019-08-15 MED ORDER — ONDANSETRON HCL 4 MG PO TABS: 4.0000 mg | ORAL_TABLET | Freq: Four times a day (QID) | ORAL | Status: DC | PRN

## 2019-08-15 MED ORDER — PROPOFOL 10 MG/ML IV BOLUS
INTRAVENOUS | Status: AC
  Filled 2019-08-15: qty 40

## 2019-08-15 MED ORDER — ENOXAPARIN SODIUM 40 MG/0.4ML ~~LOC~~ SOLN
40.0000 mg | SUBCUTANEOUS | Status: DC
  Filled 2019-08-15: qty 0.4

## 2019-08-15 MED ORDER — OXYCODONE HCL 5 MG PO TABS: 5.0000 mg | ORAL_TABLET | ORAL | Status: DC | PRN

## 2019-08-15 MED ORDER — LIDOCAINE 2% (20 MG/ML) 5 ML SYRINGE
INTRAMUSCULAR | Status: DC | PRN
  Administered 2019-08-15: 40 mg via INTRAVENOUS

## 2019-08-15 MED ORDER — ACETAMINOPHEN 500 MG PO TABS
1000.0000 mg | ORAL_TABLET | Freq: Four times a day (QID) | ORAL | Status: DC
  Administered 2019-08-15 – 2019-08-18 (×9): 1000 mg via ORAL
  Filled 2019-08-15 (×12): qty 2

## 2019-08-15 MED ORDER — DEXAMETHASONE SODIUM PHOSPHATE 10 MG/ML IJ SOLN
INTRAMUSCULAR | Status: DC | PRN
  Administered 2019-08-15: 8 mg via INTRAVENOUS

## 2019-08-15 MED ORDER — POTASSIUM CHLORIDE IN NACL 20-0.9 MEQ/L-% IV SOLN
INTRAVENOUS | Status: DC
  Filled 2019-08-15 (×2): qty 1000

## 2019-08-15 MED ORDER — VANCOMYCIN HCL 1000 MG IV SOLR
INTRAVENOUS | Status: AC
  Filled 2019-08-15: qty 1000

## 2019-08-15 MED ORDER — PROPOFOL 10 MG/ML IV BOLUS
INTRAVENOUS | Status: AC
  Filled 2019-08-15: qty 20

## 2019-08-15 MED ORDER — ACETAMINOPHEN 325 MG PO TABS: 325.0000 mg | ORAL_TABLET | Freq: Once | ORAL | Status: DC | PRN

## 2019-08-15 MED ORDER — MIDAZOLAM HCL 2 MG/2ML IJ SOLN
INTRAMUSCULAR | Status: AC
  Administered 2019-08-15: 2 mg via INTRAVENOUS
  Filled 2019-08-15: qty 2

## 2019-08-15 MED ORDER — EPHEDRINE SULFATE-NACL 50-0.9 MG/10ML-% IV SOSY
PREFILLED_SYRINGE | INTRAVENOUS | Status: DC | PRN
  Administered 2019-08-15 (×2): 5 mg via INTRAVENOUS

## 2019-08-15 MED ORDER — PROMETHAZINE HCL 25 MG/ML IJ SOLN: 6.2500 mg | INTRAMUSCULAR | Status: DC | PRN

## 2019-08-15 MED ORDER — PHENYLEPHRINE HCL-NACL 10-0.9 MG/250ML-% IV SOLN
INTRAVENOUS | Status: DC | PRN
  Administered 2019-08-15: 20 ug/min via INTRAVENOUS

## 2019-08-15 MED ORDER — FENTANYL CITRATE (PF) 250 MCG/5ML IJ SOLN
INTRAMUSCULAR | Status: DC | PRN
  Administered 2019-08-15: 50 ug via INTRAVENOUS
  Administered 2019-08-15: 25 ug via INTRAVENOUS
  Administered 2019-08-15: 50 ug via INTRAVENOUS
  Administered 2019-08-15: 25 ug via INTRAVENOUS
  Administered 2019-08-15: 50 ug via INTRAVENOUS
  Administered 2019-08-15 (×2): 25 ug via INTRAVENOUS

## 2019-08-15 MED ORDER — ONDANSETRON HCL 4 MG/2ML IJ SOLN
INTRAMUSCULAR | Status: DC | PRN
  Administered 2019-08-15: 4 mg via INTRAVENOUS

## 2019-08-15 MED ORDER — VANCOMYCIN HCL 1000 MG IV SOLR
INTRAVENOUS | Status: DC | PRN
  Administered 2019-08-15: 1000 mg

## 2019-08-15 MED ORDER — MIDAZOLAM HCL 2 MG/2ML IJ SOLN
INTRAMUSCULAR | Status: AC
  Filled 2019-08-15: qty 2

## 2019-08-15 SURGICAL SUPPLY — 66 items
BANDAGE ESMARK 6X9 LF (GAUZE/BANDAGES/DRESSINGS) ×1 IMPLANT
BIT DRILL QC 2.5MM SHRT EVO SM (DRILL) ×1 IMPLANT
BNDG COHESIVE 4X5 TAN STRL (GAUZE/BANDAGES/DRESSINGS) ×3 IMPLANT
BNDG ELASTIC 4X5.8 VLCR STR LF (GAUZE/BANDAGES/DRESSINGS) IMPLANT
BNDG ELASTIC 6X10 VLCR STRL LF (GAUZE/BANDAGES/DRESSINGS) ×3 IMPLANT
BNDG ELASTIC 6X5.8 VLCR STR LF (GAUZE/BANDAGES/DRESSINGS) IMPLANT
BNDG ESMARK 6X9 LF (GAUZE/BANDAGES/DRESSINGS) ×3
BRUSH SCRUB EZ PLAIN DRY (MISCELLANEOUS) ×6 IMPLANT
CHLORAPREP W/TINT 26 (MISCELLANEOUS) ×3 IMPLANT
COVER SURGICAL LIGHT HANDLE (MISCELLANEOUS) ×3 IMPLANT
COVER WAND RF STERILE (DRAPES) ×3 IMPLANT
DRAPE C-ARM 42X72 X-RAY (DRAPES) ×3 IMPLANT
DRAPE C-ARMOR (DRAPES) ×3 IMPLANT
DRAPE ORTHO SPLIT 77X108 STRL (DRAPES) ×4
DRAPE SURG ORHT 6 SPLT 77X108 (DRAPES) ×2 IMPLANT
DRAPE U-SHAPE 47X51 STRL (DRAPES) ×3 IMPLANT
DRILL QC 2.5MM SHORT EVOS SM (DRILL) ×3
DRSG ADAPTIC 3X8 NADH LF (GAUZE/BANDAGES/DRESSINGS) IMPLANT
DRSG MEPITEL 4X7.2 (GAUZE/BANDAGES/DRESSINGS) ×3 IMPLANT
ELECT REM PT RETURN 9FT ADLT (ELECTROSURGICAL) ×3
ELECTRODE REM PT RTRN 9FT ADLT (ELECTROSURGICAL) ×1 IMPLANT
GAUZE SPONGE 4X4 12PLY STRL (GAUZE/BANDAGES/DRESSINGS) ×6 IMPLANT
GLOVE BIO SURGEON STRL SZ 6.5 (GLOVE) ×6 IMPLANT
GLOVE BIO SURGEON STRL SZ7.5 (GLOVE) ×9 IMPLANT
GLOVE BIO SURGEONS STRL SZ 6.5 (GLOVE) ×3
GLOVE BIOGEL PI IND STRL 6.5 (GLOVE) ×1 IMPLANT
GLOVE BIOGEL PI IND STRL 7.5 (GLOVE) ×1 IMPLANT
GLOVE BIOGEL PI INDICATOR 6.5 (GLOVE) ×2
GLOVE BIOGEL PI INDICATOR 7.5 (GLOVE) ×2
GOWN STRL REUS W/ TWL LRG LVL3 (GOWN DISPOSABLE) ×2 IMPLANT
GOWN STRL REUS W/TWL LRG LVL3 (GOWN DISPOSABLE) ×4
K-WIRE 1.6 (WIRE) ×6
K-WIRE FIXATION DT 2X150 (WIRE) ×9
K-WIRE FX150X1.6XTROC PNT (WIRE) ×3
KIT TURNOVER KIT B (KITS) ×3 IMPLANT
KWIRE FIXATION DT 2X150 (WIRE) ×3 IMPLANT
KWIRE FX150X1.6XTROC PNT (WIRE) ×3 IMPLANT
MANIFOLD NEPTUNE II (INSTRUMENTS) ×3 IMPLANT
NEEDLE HYPO 21X1.5 SAFETY (NEEDLE) IMPLANT
NEEDLE HYPO 25GX1X1/2 BEV (NEEDLE) ×3 IMPLANT
NS IRRIG 1000ML POUR BTL (IV SOLUTION) ×3 IMPLANT
PACK TOTAL JOINT (CUSTOM PROCEDURE TRAY) ×3 IMPLANT
PAD ARMBOARD 7.5X6 YLW CONV (MISCELLANEOUS) ×6 IMPLANT
PAD CAST 4YDX4 CTTN HI CHSV (CAST SUPPLIES) ×1 IMPLANT
PADDING CAST COTTON 4X4 STRL (CAST SUPPLIES) ×2
PADDING CAST COTTON 6X4 STRL (CAST SUPPLIES) ×6 IMPLANT
PLATE TUB EVOS 1/3 3.5X22 2H (Plate) ×3 IMPLANT
SCREW CORT ST EVOS 3.5X60 (Screw) ×3 IMPLANT
SCREW CORT ST EVOS 3.5X65 (Screw) ×3 IMPLANT
SCREW CORT ST EVOS 3.5X70 (Screw) ×3 IMPLANT
SCREW CORT ST EVOS 3.5X90 (Screw) ×3 IMPLANT
SPONGE LAP 18X18 RF (DISPOSABLE) IMPLANT
STAPLER VISISTAT 35W (STAPLE) ×3 IMPLANT
SUCTION FRAZIER HANDLE 10FR (MISCELLANEOUS) ×2
SUCTION TUBE FRAZIER 10FR DISP (MISCELLANEOUS) ×1 IMPLANT
SUT ETHILON 3 0 PS 1 (SUTURE) ×6 IMPLANT
SUT PROLENE 0 CT (SUTURE) IMPLANT
SUT VIC AB 0 CT1 27 (SUTURE) ×2
SUT VIC AB 0 CT1 27XBRD ANBCTR (SUTURE) ×1 IMPLANT
SUT VIC AB 2-0 CT1 27 (SUTURE) ×4
SUT VIC AB 2-0 CT1 TAPERPNT 27 (SUTURE) ×2 IMPLANT
SYR CONTROL 10ML LL (SYRINGE) ×3 IMPLANT
TOWEL GREEN STERILE (TOWEL DISPOSABLE) ×6 IMPLANT
TOWEL GREEN STERILE FF (TOWEL DISPOSABLE) ×3 IMPLANT
UNDERPAD 30X30 (UNDERPADS AND DIAPERS) ×3 IMPLANT
WATER STERILE IRR 1000ML POUR (IV SOLUTION) ×3 IMPLANT

## 2019-08-15 NOTE — Anesthesia Postprocedure Evaluation (Signed)
Anesthesia Post Note  Patient: David Peterson  Procedure(s) Performed: OPEN REDUCTION INTERNAL FIXATION (ORIF) RIGHT PILON FRACTURE (Right Leg Lower)     Patient location during evaluation: PACU Anesthesia Type: General Level of consciousness: awake and alert Pain management: pain level controlled Vital Signs Assessment: post-procedure vital signs reviewed and stable Respiratory status: spontaneous breathing, nonlabored ventilation and respiratory function stable Cardiovascular status: stable and blood pressure returned to baseline Postop Assessment: no apparent nausea or vomiting Anesthetic complications: no    Last Vitals:  Vitals:   08/15/19 1600 08/15/19 1615  BP: 124/73   Pulse: 76 69  Resp: 16 16  Temp:    SpO2: 100% 100%    Last Pain:  Vitals:   08/15/19 1600  TempSrc:   PainSc: Asleep                 [name] A.

## 2019-08-15 NOTE — Progress Notes (Addendum)
Patient ate Taki chips and lemonade soda @ 0200 today. Anesthesiologist on call, Dr. Mal Amabile notified.

## 2019-08-15 NOTE — Progress Notes (Signed)
No labs needed, per Dr. Hart [place].

## 2019-08-15 NOTE — Discharge Instructions (Signed)
Orthopaedic Trauma Service Discharge Instructions   General Discharge Instructions  WEIGHT BEARING STATUS:Non-weightbearing right leg  RANGE OF MOTION/ACTIVITY: Okay for knee motion. Do NOT remove splint  Wound Care: Do NOT remove splint. Okay to shower but must cover splint to keep it dry.    DVT/PE prophylaxis: Aspirin 325 mg twice daily  Diet: as you were eating previously.  Can use over the counter stool softeners and bowel preparations, such as Miralax, to help with bowel movements.  Narcotics can be constipating.  Be sure to drink plenty of fluids  PAIN MEDICATION USE AND EXPECTATIONS  You have likely been given narcotic medications to help control your pain.  After a traumatic event that results in an fracture (broken bone) with or without surgery, it is ok to use narcotic pain medications to help control one's pain.  We understand that everyone responds to pain differently and each individual patient will be evaluated on a regular basis for the continued need for narcotic medications. Ideally, narcotic medication use should last no more than 6-8 weeks (coinciding with fracture healing).   As a patient it is your responsibility as well to monitor narcotic medication use and report the amount and frequency you use these medications when you come to your office visit.   We would also advise that if you are using narcotic medications, you should take a dose prior to therapy to maximize you participation.  IF YOU ARE ON NARCOTIC MEDICATIONS IT IS NOT PERMISSIBLE TO OPERATE A MOTOR VEHICLE (MOTORCYCLE/CAR/TRUCK/MOPED) OR HEAVY MACHINERY DO NOT MIX NARCOTICS WITH OTHER CNS (CENTRAL NERVOUS SYSTEM) DEPRESSANTS SUCH AS ALCOHOL   STOP SMOKING OR USING NICOTINE PRODUCTS!!!!  As discussed nicotine severely impairs your body's ability to heal surgical and traumatic wounds but also impairs bone healing.  Wounds and bone heal by forming microscopic blood vessels (angiogenesis) and nicotine is a  vasoconstrictor (essentially, shrinks blood vessels).  Therefore, if vasoconstriction occurs to these microscopic blood vessels they essentially disappear and are unable to deliver necessary nutrients to the healing tissue.  This is one modifiable factor that you can do to dramatically increase your chances of healing your injury.    (This means no smoking, no nicotine gum, patches, etc)  DO NOT USE NONSTEROIDAL ANTI-INFLAMMATORY DRUGS (NSAID'S)  Using products such as Advil (ibuprofen), Aleve (naproxen), Motrin (ibuprofen) for additional pain control during fracture healing can delay and/or prevent the healing response.  If you would like to take over the counter (OTC) medication, Tylenol (acetaminophen) is ok.  However, some narcotic medications that are given for pain control contain acetaminophen as well. Therefore, you should not exceed more than 4000 mg of tylenol in a day if you do not have liver disease.  Also note that there are may OTC medicines, such as cold medicines and allergy medicines that my contain tylenol as well.  If you have any questions about medications and/or interactions please ask your doctor/PA or your pharmacist.      ICE AND ELEVATE INJURED/OPERATIVE EXTREMITY  Using ice and elevating the injured extremity above your heart can help with swelling and pain control.  Icing in a pulsatile fashion, such as 20 minutes on and 20 minutes off, can be followed.    Do not place ice directly on skin. Make sure there is a barrier between to skin and the ice pack.    Using frozen items such as frozen peas works well as the conform nicely to the are that needs to be iced.  USE AN ACE WRAP OR TED HOSE FOR SWELLING CONTROL  In addition to icing and elevation, Ace wraps or TED hose are used to help limit and resolve swelling.  It is recommended to use Ace wraps or TED hose until you are informed to stop.    When using Ace Wraps start the wrapping distally (farthest away from the body) and  wrap proximally (closer to the body)   Example: If you had surgery on your leg or thing and you do not have a splint on, start the ace wrap at the toes and work your way up to the thigh        If you had surgery on your upper extremity and do not have a splint on, start the ace wrap at your fingers and work your way up to the upper arm  IF YOU ARE IN A SPLINT OR CAST DO NOT REMOVE IT FOR ANY REASON   If your splint gets wet for any reason please contact the office immediately. You may shower in your splint or cast as long as you keep it dry.  This can be done by wrapping in a cast cover or garbage back (or similar)  Do Not stick any thing down your splint or cast such as pencils, money, or hangers to try and scratch yourself with.  If you feel itchy take benadryl as prescribed on the bottle for itching  IF YOU ARE IN A CAM BOOT (BLACK BOOT)  You may remove boot periodically. Perform daily dressing changes as noted below.  Wash the liner of the boot regularly and wear a sock when wearing the boot. It is recommended that you sleep in the boot until told otherwise   CALL THE OFFICE WITH ANY QUESTIONS OR CONCERNS: [phone]   VISIT OUR WEBSITE FOR ADDITIONAL INFORMATION: [email or web address]     Discharge Wound Care Instructions  Do NOT apply any ointments, solutions or lotions to pin sites or surgical wounds.  These prevent needed drainage and even though solutions like hydrogen peroxide kill bacteria, they also damage cells lining the pin sites that help fight infection.  Applying lotions or ointments can keep the wounds moist and can cause them to breakdown and open up as well. This can increase the risk for infection. When in doubt call the office.  Surgical incisions should be dressed daily.  If any drainage is noted, use one layer of adaptic, then gauze, Kerlix, and an ace wrap.  Once the incision is completely dry and without drainage, it may be left open to air out.  Showering may  begin 36-48 hours later.  Cleaning gently with soap and water.  Traumatic wounds should be dressed daily as well.    One layer of adaptic, gauze, Kerlix, then ace wrap.  The adaptic can be discontinued once the draining has ceased    If you have a wet to dry dressing: wet the gauze with saline the squeeze as much saline out so the gauze is moist (not soaking wet), place moistened gauze over wound, then place a dry gauze over the moist one, followed by Kerlix wrap, then ace wrap.

## 2019-08-15 NOTE — Anesthesia Preprocedure Evaluation (Addendum)
Anesthesia Evaluation  Patient identified by MRN, date of birth, ID band Patient awake    Reviewed: Allergy & Precautions, NPO status , Patient's Chart, lab work & pertinent test results  Airway Mallampati: I  TM Distance: >3 FB Neck ROM: Full    Dental  (+) Teeth Intact, Dental Advisory Given   Pulmonary Current Smoker,    breath sounds clear to auscultation       Cardiovascular negative cardio ROS   Rhythm:Regular Rate:Normal     Neuro/Psych negative neurological ROS  negative psych ROS   GI/Hepatic negative GI ROS, Neg liver ROS,   Endo/Other  negative endocrine ROS  Renal/GU negative Renal ROS     Musculoskeletal negative musculoskeletal ROS (+)   Abdominal Normal abdominal exam  (+)   Peds  Hematology negative hematology ROS (+)   Anesthesia Other Findings   Reproductive/Obstetrics                            Anesthesia Physical Anesthesia Plan  ASA: II  Anesthesia Plan: General   Post-op Pain Management: GA combined w/ Regional for post-op pain   Induction: Intravenous  PONV Risk Score and Plan: 2 and Ondansetron and Midazolam  Airway Management Planned: LMA  Additional Equipment: None  Intra-op Plan:   Post-operative Plan: Extubation in OR  Informed Consent: I have reviewed the patients History and Physical, chart, labs and discussed the procedure including the risks, benefits and alternatives for the proposed anesthesia with the patient or authorized representative who has indicated his/her understanding and acceptance.     Dental advisory given  Plan Discussed with: CRNA  Anesthesia Plan Comments:       Anesthesia Quick Evaluation

## 2019-08-15 NOTE — Anesthesia Procedure Notes (Signed)
Anesthesia Regional Block: Popliteal block   Pre-Anesthetic Checklist: ,, timeout performed, Correct Patient, Correct Site, Correct Laterality, Correct Procedure, Correct Position, site marked, Risks and benefits discussed,  Surgical consent,  Pre-op evaluation,  At surgeon's request and post-op pain management  Laterality: Right  Prep: chloraprep       Needles:  Injection technique: Single-shot  Needle Type: Echogenic Stimulator Needle     Needle Length: 9cm  Needle Gauge: 21     Additional Needles:   Procedures:,,,, ultrasound used (permanent image in chart),,,,  Narrative:  Start time: 08/15/2019 11:10 AM End time: 08/15/2019 11:15 AM Injection made incrementally with aspirations every 5 mL.  Performed by: Personally  Anesthesiologist: Shelton Silvas, MD  Additional Notes: Patient tolerated the procedure well. Local anesthetic introduced in an incremental fashion under minimal resistance after negative aspirations. No paresthesias were elicited. After completion of the procedure, no acute issues were identified and patient continued to be monitored by RN.

## 2019-08-15 NOTE — Transfer of Care (Signed)
Immediate Anesthesia Transfer of Care Note  Patient: David Peterson  Procedure(s) Performed: OPEN REDUCTION INTERNAL FIXATION (ORIF) RIGHT PILON FRACTURE (Right Leg Lower)  Patient Location: PACU  Anesthesia Type:GA combined with regional for post-op pain  Level of Consciousness: drowsy and responds to stimulation  Airway & Oxygen Therapy: Patient Spontanous Breathing and Patient connected to nasal cannula oxygen  Post-op Assessment: Report given to RN and Post -op Vital signs reviewed and stable  Post vital signs: Reviewed and stable  Last Vitals:  Vitals Value Taken Time  BP 116/65 08/15/19 1533  Temp    Pulse 68 08/15/19 1534  Resp 17 08/15/19 1534  SpO2 100 % 08/15/19 1534  Vitals shown include unvalidated device data.  Last Pain:  Vitals:   08/15/19 0704  TempSrc:   PainSc: 9       Patients Stated Pain Goal: 5 (08/15/19 0704)  Complications: No apparent anesthesia complications

## 2019-08-15 NOTE — Anesthesia Procedure Notes (Signed)
Anesthesia Regional Block: Adductor canal block   Pre-Anesthetic Checklist: ,, timeout performed, Correct Patient, Correct Site, Correct Laterality, Correct Procedure, Correct Position, site marked, Risks and benefits discussed,  Surgical consent,  Pre-op evaluation,  At surgeon's request and post-op pain management  Laterality: Right  Prep: chloraprep       Needles:  Injection technique: Single-shot  Needle Type: Echogenic Stimulator Needle     Needle Length: 9cm  Needle Gauge: 21     Additional Needles:   Procedures:,,,, ultrasound used (permanent image in chart),,,,  Narrative:  Start time: 08/15/2019 11:15 AM End time: 08/15/2019 11:20 AM Injection made incrementally with aspirations every 5 mL.  Performed by: Personally  Anesthesiologist: Shelton Silvas, MD  Additional Notes: Patient tolerated the procedure well. Local anesthetic introduced in an incremental fashion under minimal resistance after negative aspirations. No paresthesias were elicited. After completion of the procedure, no acute issues were identified and patient continued to be monitored by RN.

## 2019-08-15 NOTE — Plan of Care (Signed)
  Problem: Pain Managment: Goal: General experience of comfort will improve Outcome: Progressing   Problem: Safety: Goal: Ability to remain free from injury will improve Outcome: Progressing   Problem: Education: Goal: Required Educational Video(s) Outcome: Progressing

## 2019-08-15 NOTE — Anesthesia Procedure Notes (Signed)
Procedure Name: LMA Insertion Date/Time: 08/15/2019 12:05 PM Performed by: Demetrio Lapping, CRNA Pre-anesthesia Checklist: Patient identified, Emergency Drugs available, Suction available and Patient being monitored Patient Re-evaluated:Patient Re-evaluated prior to induction Oxygen Delivery Method: Circle System Utilized Preoxygenation: Pre-oxygenation with 100% oxygen Induction Type: IV induction Ventilation: Mask ventilation without difficulty LMA: LMA inserted LMA Size: 5.0 Number of attempts: 1 Airway Equipment and Method: Bite block Placement Confirmation: positive ETCO2 Tube secured with: Tape Dental Injury: Teeth and Oropharynx as per pre-operative assessment
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
## 2019-08-16 ENCOUNTER — Encounter: Payer: Self-pay | Admitting: *Deleted

## 2019-08-16 DIAGNOSIS — S32810A Multiple fractures of pelvis with stable disruption of pelvic ring, initial encounter for closed fracture: Secondary | ICD-10-CM | POA: Insufficient documentation

## 2019-08-16 DIAGNOSIS — S32401A Unspecified fracture of right acetabulum, initial encounter for closed fracture: Secondary | ICD-10-CM

## 2019-08-16 DIAGNOSIS — S93431A Sprain of tibiofibular ligament of right ankle, initial encounter: Secondary | ICD-10-CM

## 2019-08-16 DIAGNOSIS — S82871A Displaced pilon fracture of right tibia, initial encounter for closed fracture: Secondary | ICD-10-CM | POA: Diagnosis not present

## 2019-08-16 DIAGNOSIS — S73004A Unspecified dislocation of right hip, initial encounter: Secondary | ICD-10-CM

## 2019-08-16 MED ORDER — GABAPENTIN 100 MG PO CAPS
100.0000 mg | ORAL_CAPSULE | Freq: Three times a day (TID) | ORAL | Status: DC
  Administered 2019-08-16 – 2019-08-18 (×7): 100 mg via ORAL
  Filled 2019-08-16 (×7): qty 1

## 2019-08-16 MED ORDER — GABAPENTIN 100 MG PO CAPS: 200.0000 mg | ORAL_CAPSULE | Freq: Three times a day (TID) | ORAL | Status: DC

## 2019-08-16 MED ORDER — VITAMIN D (ERGOCALCIFEROL) 1.25 MG (50000 UNIT) PO CAPS
50000.0000 [IU] | ORAL_CAPSULE | ORAL | Status: DC
  Administered 2019-08-16: 50000 [IU] via ORAL
  Filled 2019-08-16: qty 1

## 2019-08-16 MED ORDER — VITAMIN D 25 MCG (1000 UNIT) PO TABS
2000.0000 [IU] | ORAL_TABLET | Freq: Every day | ORAL | Status: DC
  Administered 2019-08-17 – 2019-08-18 (×2): 2000 [IU] via ORAL
  Filled 2019-08-16 (×2): qty 2

## 2019-08-16 MED ORDER — KETOROLAC TROMETHAMINE 15 MG/ML IJ SOLN
15.0000 mg | Freq: Four times a day (QID) | INTRAMUSCULAR | Status: DC
  Filled 2019-08-16 (×2): qty 1

## 2019-08-16 MED ORDER — VITAMIN D 25 MCG (1000 UNIT) PO TABS
2000.0000 [IU] | ORAL_TABLET | Freq: Two times a day (BID) | ORAL | Status: DC
  Administered 2019-08-16: 2000 [IU] via ORAL
  Filled 2019-08-16: qty 2

## 2019-08-16 MED ORDER — KETOROLAC TROMETHAMINE 10 MG PO TABS
10.0000 mg | ORAL_TABLET | Freq: Four times a day (QID) | ORAL | Status: DC
  Administered 2019-08-16 – 2019-08-18 (×6): 10 mg via ORAL
  Filled 2019-08-16 (×9): qty 1

## 2019-08-16 MED ORDER — KETOROLAC TROMETHAMINE 10 MG PO TABS
20.0000 mg | ORAL_TABLET | Freq: Once | ORAL | Status: AC
  Administered 2019-08-16: 16:00:00 20 mg via ORAL
  Filled 2019-08-16: qty 2
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
## 2019-08-16 NOTE — Plan of Care (Signed)
  Problem: Education: Goal: Knowledge of General Education information will improve Description Including pain rating scale, medication(s)/side effects and non-pharmacologic comfort measures Outcome: Progressing   
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
## 2019-08-17 ENCOUNTER — Observation Stay (HOSPITAL_COMMUNITY): Payer: Medicaid - Out of State

## 2019-08-17 DIAGNOSIS — S82871A Displaced pilon fracture of right tibia, initial encounter for closed fracture: Secondary | ICD-10-CM | POA: Diagnosis not present
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
## 2019-08-18 DIAGNOSIS — S82871A Displaced pilon fracture of right tibia, initial encounter for closed fracture: Secondary | ICD-10-CM | POA: Diagnosis not present

## 2019-08-18 MED ORDER — GABAPENTIN 300 MG PO CAPS: 300.0000 mg | ORAL_CAPSULE | Freq: Three times a day (TID) | ORAL | 0 refills | Status: DC | PRN

## 2019-08-18 MED ORDER — METHOCARBAMOL 500 MG PO TABS: 500.0000 mg | ORAL_TABLET | Freq: Four times a day (QID) | ORAL | 0 refills | Status: DC | PRN

## 2019-08-18 MED ORDER — OXYCODONE HCL 10 MG PO TABS: 10.0000 mg | ORAL_TABLET | Freq: Four times a day (QID) | ORAL | 0 refills | Status: DC | PRN

## 2019-08-18 NOTE — Progress Notes (Signed)
David Peterson to be D/C'd  per MD order. Discussed with the patient and all questions fully answered.  VSS, Skin clean, dry and intact without evidence of skin break down, no evidence of skin tears noted.  IV catheter discontinued intact. Site without signs and symptoms of complications. Dressing and pressure applied.  An After Visit Summary was printed and given to the patient. Patient received prescription.  D/c education completed with patient/family including follow up instructions, medication list, d/c activities limitations if indicated, with other d/c instructions as indicated by MD - patient able to verbalize understanding, all questions fully answered.   Patient instructed to return to ED, call 911, or call MD for any changes in condition.   Patient to be escorted via WC, and D/C home via private auto.

## 2019-08-18 NOTE — Plan of Care (Signed)
  Problem: Education: Goal: Knowledge of General Education information will improve Description: Including pain rating scale, medication(s)/side effects and non-pharmacologic comfort measures Outcome: Completed/Met   Problem: Health Behavior/Discharge Planning: Goal: Ability to manage health-related needs will improve Outcome: Completed/Met   Problem: Clinical Measurements: Goal: Ability to maintain clinical measurements within normal limits will improve Outcome: Completed/Met Goal: Will remain free from infection Outcome: Completed/Met Goal: Diagnostic test results will improve Outcome: Completed/Met Goal: Respiratory complications will improve Outcome: Completed/Met Goal: Cardiovascular complication will be avoided Outcome: Completed/Met   Problem: Activity: Goal: Risk for activity intolerance will decrease Outcome: Completed/Met   Problem: Nutrition: Goal: Adequate nutrition will be maintained Outcome: Completed/Met   Problem: Coping: Goal: Level of anxiety will decrease Outcome: Completed/Met   Problem: Elimination: Goal: Will not experience complications related to bowel motility Outcome: Completed/Met Goal: Will not experience complications related to urinary retention Outcome: Completed/Met   Problem: Pain Managment: Goal: General experience of comfort will improve Outcome: Completed/Met   Problem: Safety: Goal: Ability to remain free from injury will improve Outcome: Completed/Met   Problem: Skin Integrity: Goal: Risk for impaired skin integrity will decrease Outcome: Completed/Met   Problem: Education: Goal: Required Educational Video(s) Outcome: Completed/Met   Problem: Clinical Measurements: Goal: Ability to maintain clinical measurements within normal limits will improve Outcome: Completed/Met Goal: Postoperative complications will be avoided or minimized Outcome: Completed/Met   Problem: Skin Integrity: Goal: Demonstration of wound healing  without infection will improve Outcome: Completed/Met

## 2019-08-18 NOTE — Discharge Summary (Signed)
Orthopaedic Trauma Service (OTS) Discharge Summary   Patient ID: David Peterson MRN: [id number] DOB/AGE: 05-09-1990 29 y.o.  Admit date: 08/15/2019 Discharge date: 08/18/2019  Admission Diagnoses: Right pilon fracture  Discharge Diagnoses:  Principal Problem:   Closed displaced pilon fracture of right tibia Active Problems:   Closed right pilon fracture   History reviewed. No pertinent past medical history.   Procedures Performed: 1. CPT 06269-SWNI reduction internal fixation of right pilon fracture 2. CPT 27829-Open reduction internal fixation of right syndesmosis disruption 3. CPT 20694-Removal of external fixator from right ankle 4. CPT 11044-Debridement of external fixator pin sites.  Discharged Condition: good  Hospital Course: Patient presented to [facility] on 08/15/2019 for scheduled procedure.  Patient taken to the operating room by Dr. Doreatha Martin and underwent the above procedures.  Tolerated this well without complications.  Was placed in a short leg splint postoperatively and instructed to be nonweightbearing on the right lower extremity. Was admitted to the hospital for observation and pain control.  Began working with physical and occupational therapy starting on postoperative day #1.  Was scheduled to begin Lovenox for DVT prophylaxis starting on postoperative day #1 but patient refused Lovenox injections while in the hospital.   On 08/18/2019, the patient was tolerating diet, working well with therapies, pain well controlled, vital signs stable, dressings clean, dry, intact and felt stable for discharge to  home. Patient will follow up as below and knows to call with questions or concerns.     Consults: None  Significant Diagnostic Studies:   Results for orders placed or performed during the hospital encounter of 08/15/19 (from the past 168 hour(s))  Respiratory Panel by RT PCR (Flu A&B, Covid) - Nasopharyngeal Swab   Collection Time: 08/15/19   5:59 AM   Specimen: Nasopharyngeal Swab  Result Value Ref Range   SARS Coronavirus 2 by RT PCR NEGATIVE NEGATIVE   Influenza A by PCR NEGATIVE NEGATIVE   Influenza B by PCR NEGATIVE NEGATIVE     Treatments: IV hydration, antibiotics: Ancef, analgesia: acetaminophen, Dilaudid and oxycodone, therapies: PT and OT and surgery: Removal of external fixator and ORIF of right pilon fracture  Discharge Exam: General: Laying in bed, no acute distress.  Looking at his phone Respiratory: No increased work of breathing at rest Right lower extremity: Short leg splint in place. Decreased sensation with light touch of the great toe.  Sensation intact to light touch of the lesser toes on both the dorsal and plantar surface.  Actively moves toes a very minimal amount.   Tolerates very minimal amount of passive flexion of his toes.  Extremity is warm. Incision to the right hip is healing well.  Disposition: Discharge disposition: 01-Home or Self Care        Allergies as of 08/18/2019       Reactions   Penicillins Other (See Comments)   Pt says he doesn't know, he was just told he was allergic to it. Tolerates Cefazolin   Fish Allergy Rash   Shellfish Allergy Rash        Medication List     TAKE these medications    acetaminophen 500 MG tablet Commonly known as: TYLENOL Take 2 tablets (1,000 mg total) by mouth every 6 (six) hours as needed. What changed: reasons to take this   aspirin EC 325 MG tablet Take 1 tablet (325 mg total) by mouth 2 (two) times daily.   docusate sodium 100 MG capsule Commonly known as: COLACE Take 1 capsule (  100 mg total) by mouth 2 (two) times daily as needed for mild constipation.   gabapentin 300 MG capsule Commonly known as: NEURONTIN Take 1 capsule (300 mg total) by mouth 3 (three) times daily as needed. What changed: reasons to take this   methocarbamol 500 MG tablet Commonly known as: ROBAXIN Take 1 tablet (500 mg total) by mouth every 6 (six)  hours as needed for muscle spasms.   ondansetron 4 MG tablet Commonly known as: ZOFRAN Take 1 tablet (4 mg total) by mouth every 6 (six) hours as needed for nausea.   Oxycodone HCl 10 MG Tabs Take 1 tablet (10 mg total) by mouth every 6 (six) hours as needed. What changed: reasons to take this   polyethylene glycol 17 g packet Commonly known as: MIRALAX / GLYCOLAX Take 17 g by mouth 2 (two) times daily as needed. What changed: reasons to take this   Vitamin D (Ergocalciferol) 1.25 MG (50000 UNIT) Caps capsule Commonly known as: DRISDOL Take 1 capsule (50,000 Units total) by mouth every 7 (seven) days.   Vitamin D3 25 MCG tablet Commonly known as: Vitamin D Take 2 tablets (2,000 Units total) by mouth 2 (two) times daily.       Follow-up Information     Haddix, Gillie Manners, MD. Schedule an appointment as soon as possible for a visit in 2 weeks.   Specialty: Orthopedic Surgery Why: For suture removal, For wound re-check, for splint removal Contact information: [address] [place] [address] [phone]            Discharge Instructions and Plan: Patient will be discharged to home. Will be discharged on Aspirin 325 mg twice daily x30 days for DVT prophylaxis. Patient has been walker at home that he will use at discharge.  Was previously given a 3-in-1 bedside commode as well as a wheelchair during last hospitalization but he does not know the location of these.  He is not eligible to receive replacement for either of these items.  He has been instructed to check with a local medical supply store if he is interested in purchasing a wheelchair or bedside commode.  Patient will follow up with Dr. Jena Gauss in 2 weeks for repeat x-rays, splint removal, suture removal.   Signed:  Shawn Route. Ladonna Snide ?([phone]? (phone) 08/18/2019, 12:12 PM  [facility] [address] [place] [address] [phone] (F)

## 2019-08-19 ENCOUNTER — Other Ambulatory Visit: Payer: Self-pay | Admitting: Student

## 2019-09-02 NOTE — Progress Notes (Signed)
  Radiation Oncology         [phone]) [phone] ________________________________  Name: David Peterson MRN: [id number]  Date: 08/01/2019  DOB: 1990-05-04  End of Treatment Note  Diagnosis: Right hip fracture    Indication for treatment:  Prophylactic against heterotopic ossification       Radiation treatment date:   08-01-19  Site/dose:   Right hip / 7Gy in 1 fraction  Beams/energy:  AP PA / 10 X  Narrative: The patient tolerated radiation treatment relatively well.     Plan: Follow-up with orthopedic surgery.  -----------------------------------  Lonie Peak, MD

## 2022-03-01 IMAGING — RF DG ANKLE COMPLETE 3+V*R*
1 series · 7 of 7 positions shown · non-contrast
Comparison: CT right ankle July 28, 2019

CLINICAL DATA: Open reduction internal fixation for fracture

EXAM:
RIGHT ANKLE - COMPLETE 3+ VIEW; DG C-ARM 1-60 MIN

[Series 1: run · 7 of 7 slices shown]
[im 1/7]
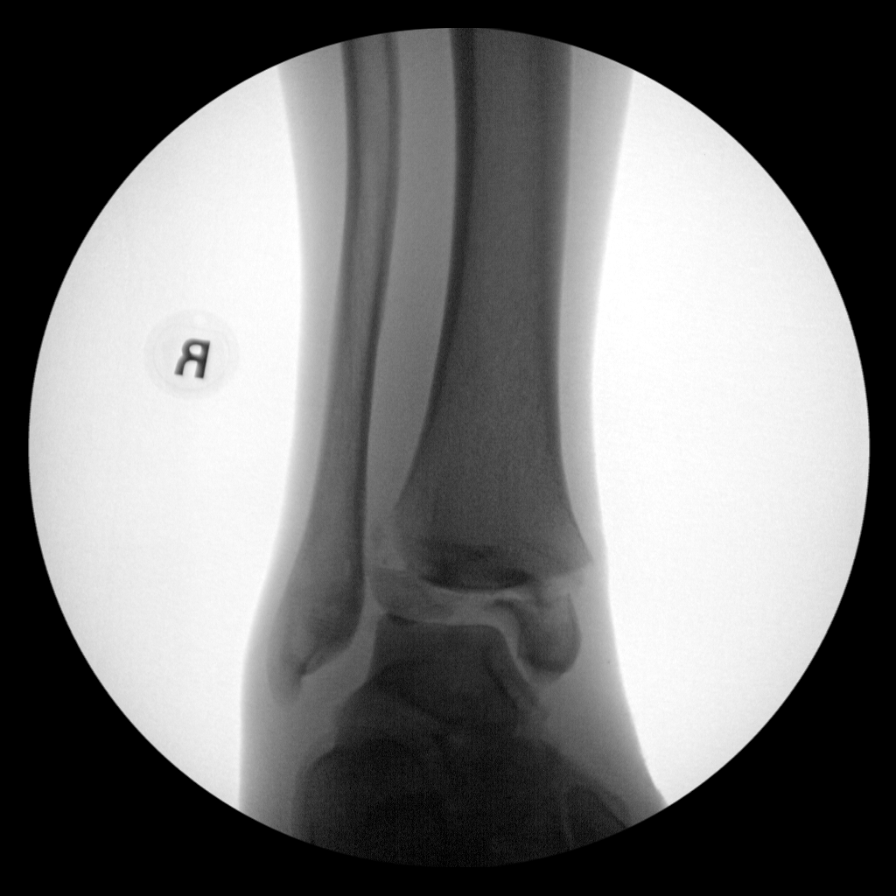
[im 2/7]
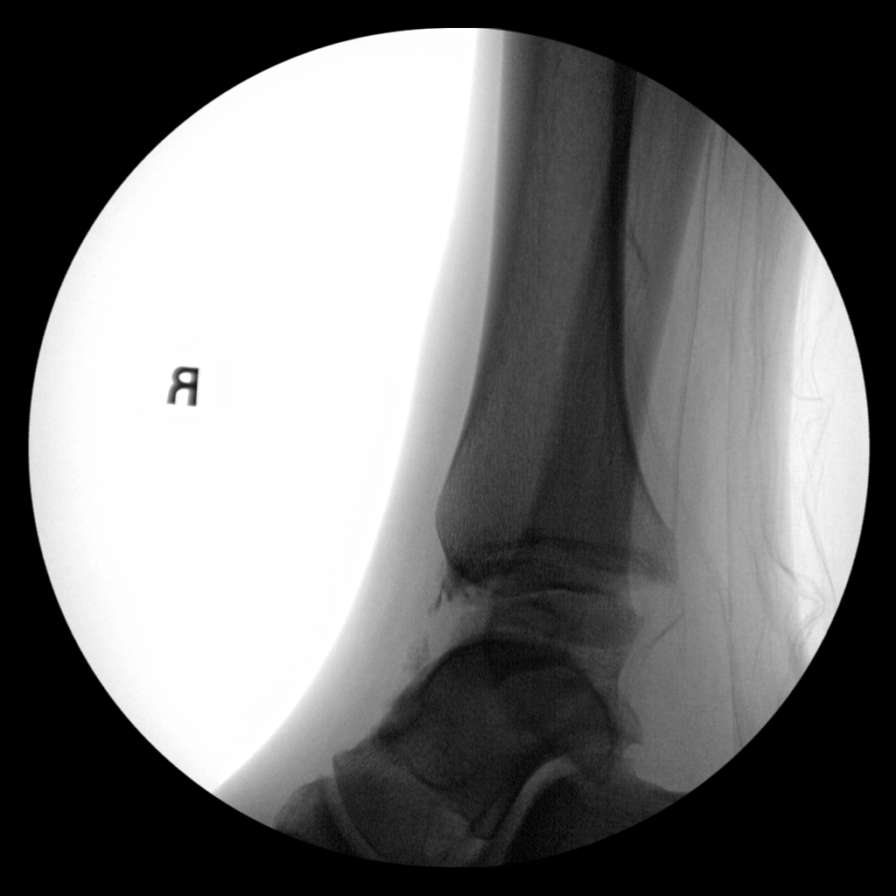
[im 3/7]
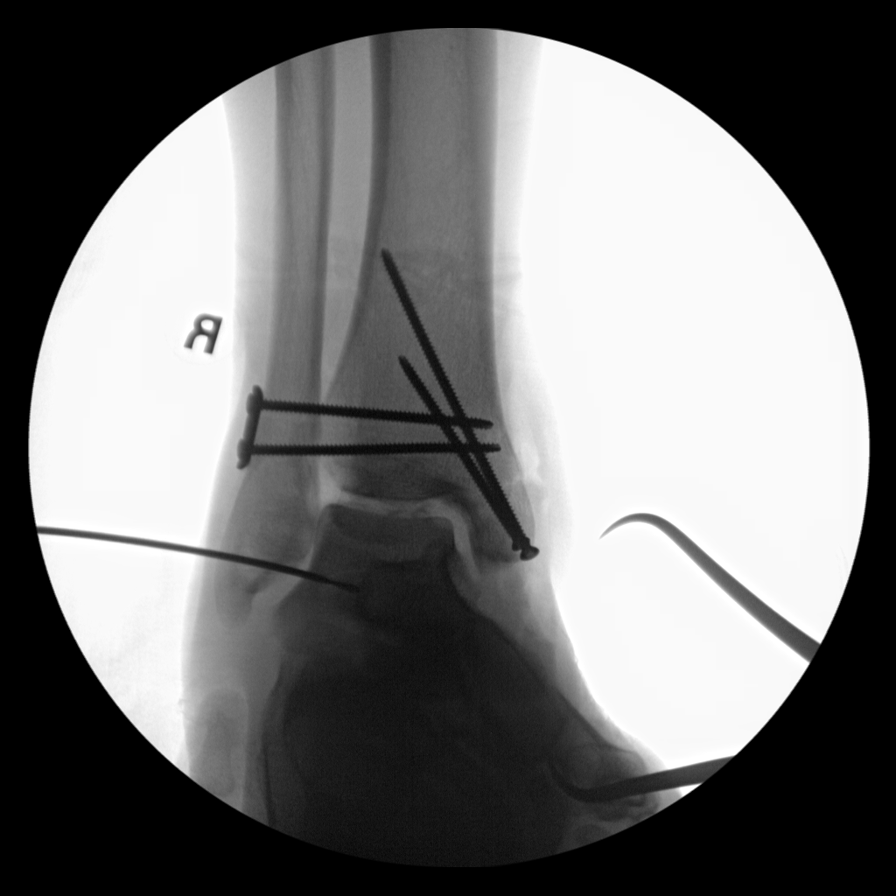
[im 4/7]
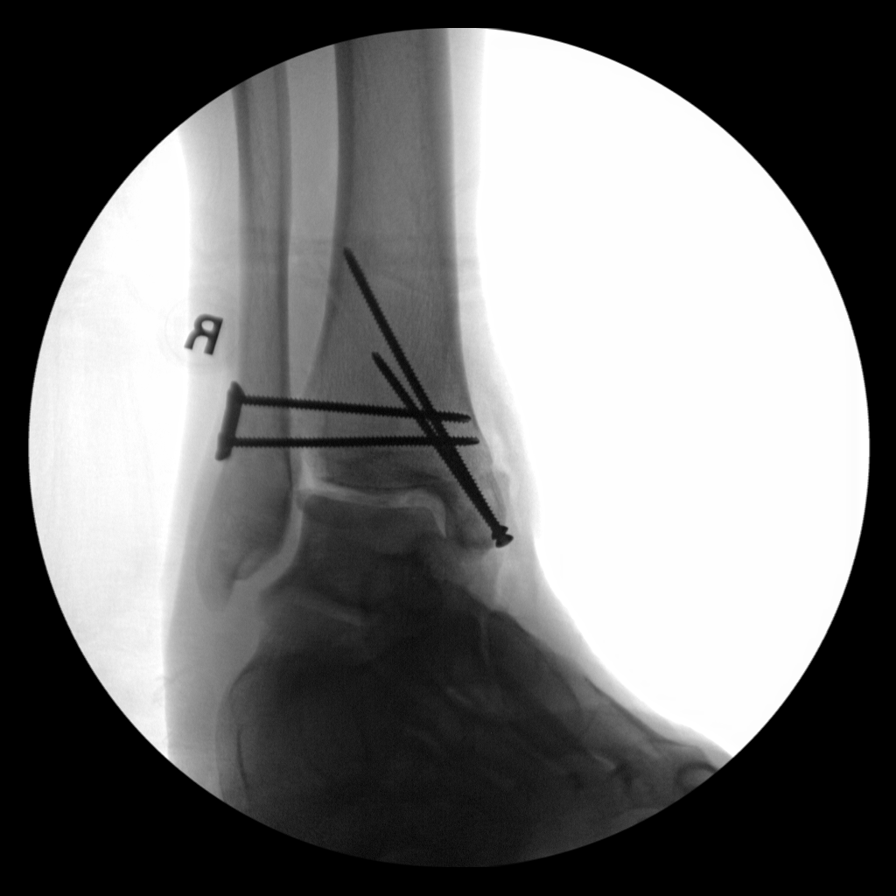
[im 5/7]
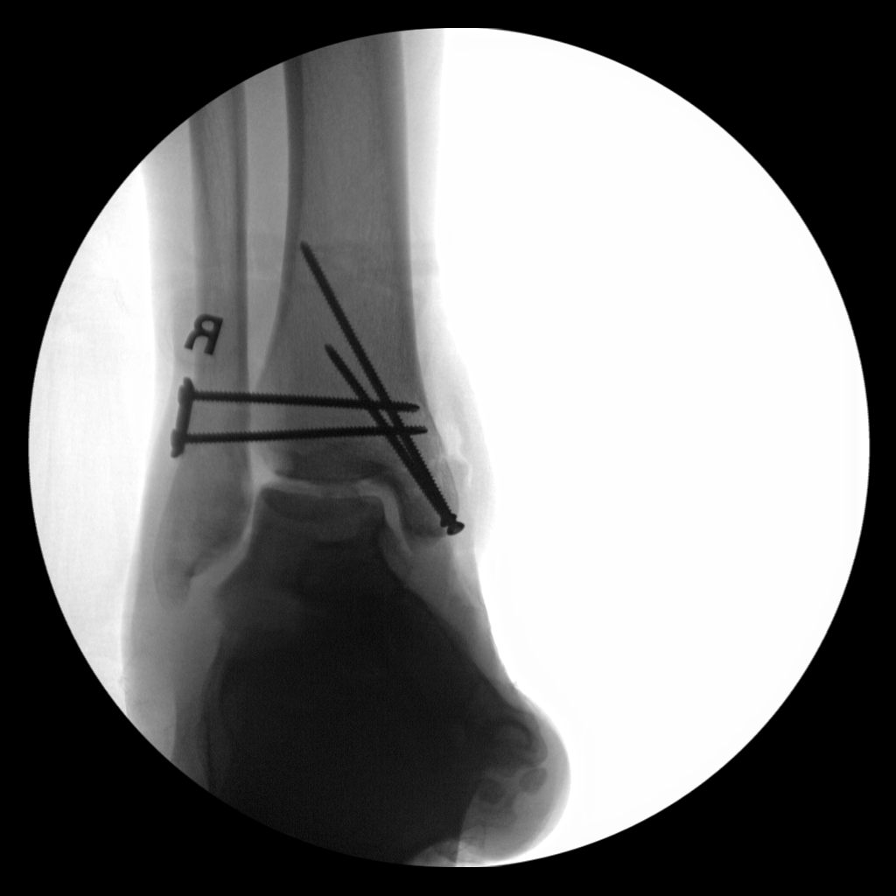
[im 6/7]
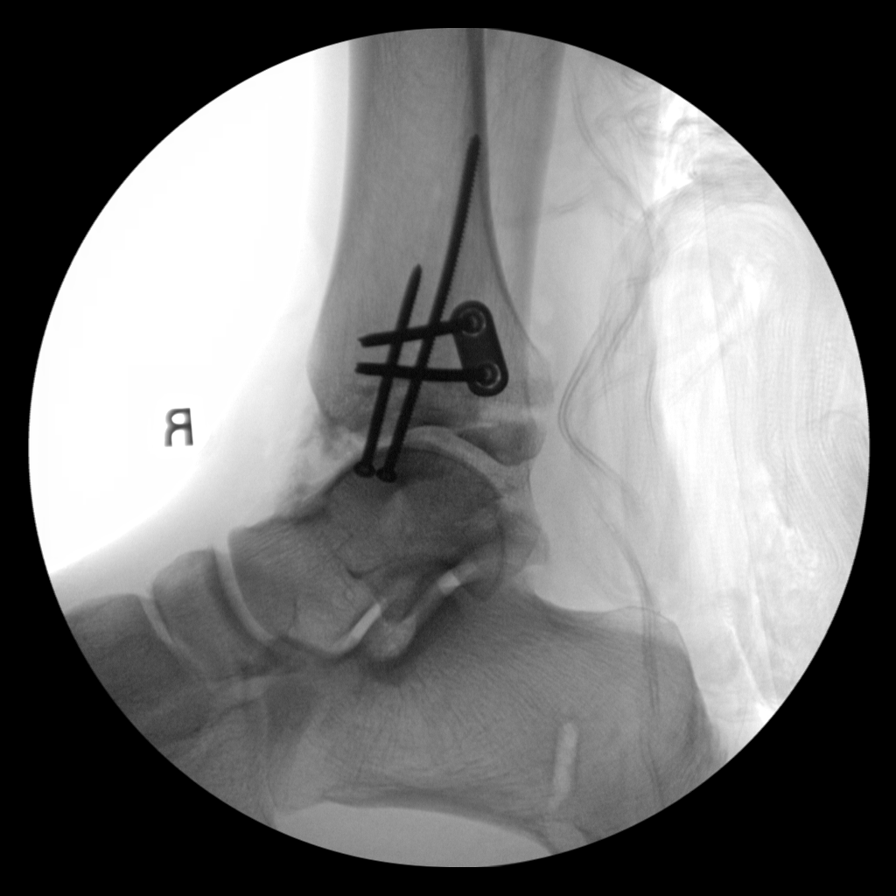
[im 7/7]
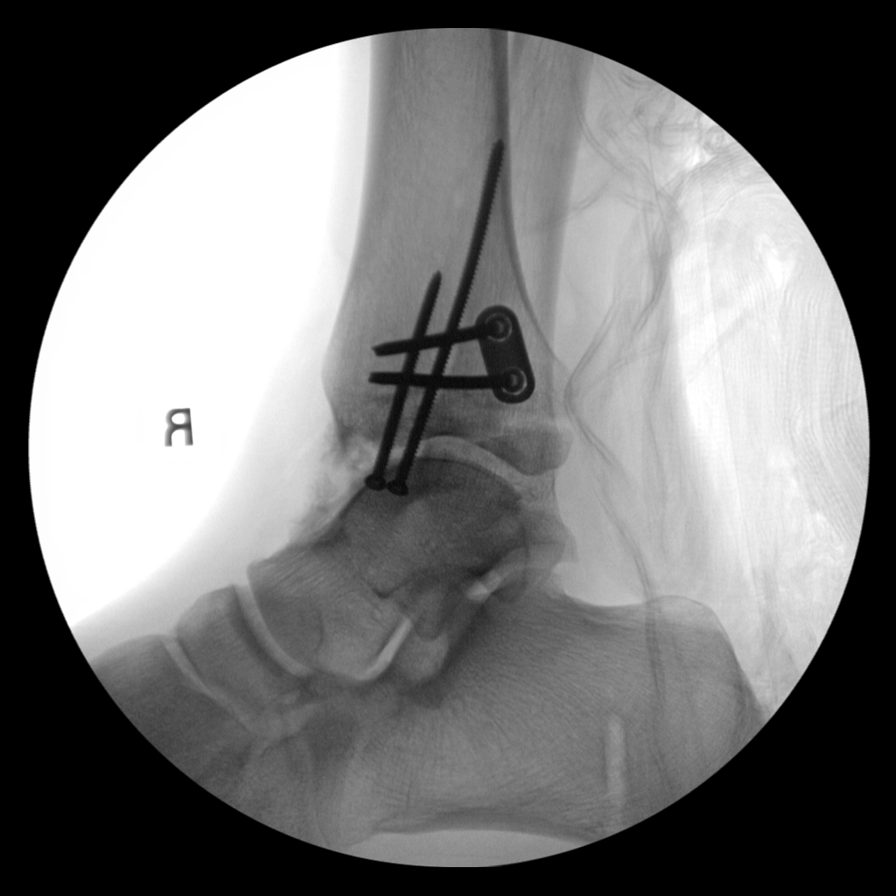

[7 of 7 positions shown; findings below may reference images not displayed]

FINDINGS: Frontal, oblique, and lateral views were obtained. There is screw
fixation through the medial malleolus with alignment in this area
essentially anatomic. There is a fracture, mildly displaced, along
the posterior distal tibia with mild displacement of fracture
fragments. There is screw and plate fixation through the distal
tibiofibular syndesmosis. Ankle mortise appears intact. Small
fracture fragments are noted anteriorly.
IMPRESSION: Surgical fixation through fractures of the medial malleolus as well
as fixation of the tibia fibular syndesmosis due to mortise
instability. Mildly displaced fracture along the posterior, inferior
tibia remains. Several bony fragments present anteriorly. Ankle
mortise appears grossly intact.

## 2022-03-01 IMAGING — DX DG ANKLE PORT 2V*R*
3 series · 3 of 3 positions shown · non-contrast
Comparison: Right ankle x-rays from same day. CT right ankle dated
July 28, 2019.

CLINICAL DATA: Right ankle ORIF.

EXAM:
PORTABLE RIGHT ANKLE - 2 VIEW

[ankle ap]
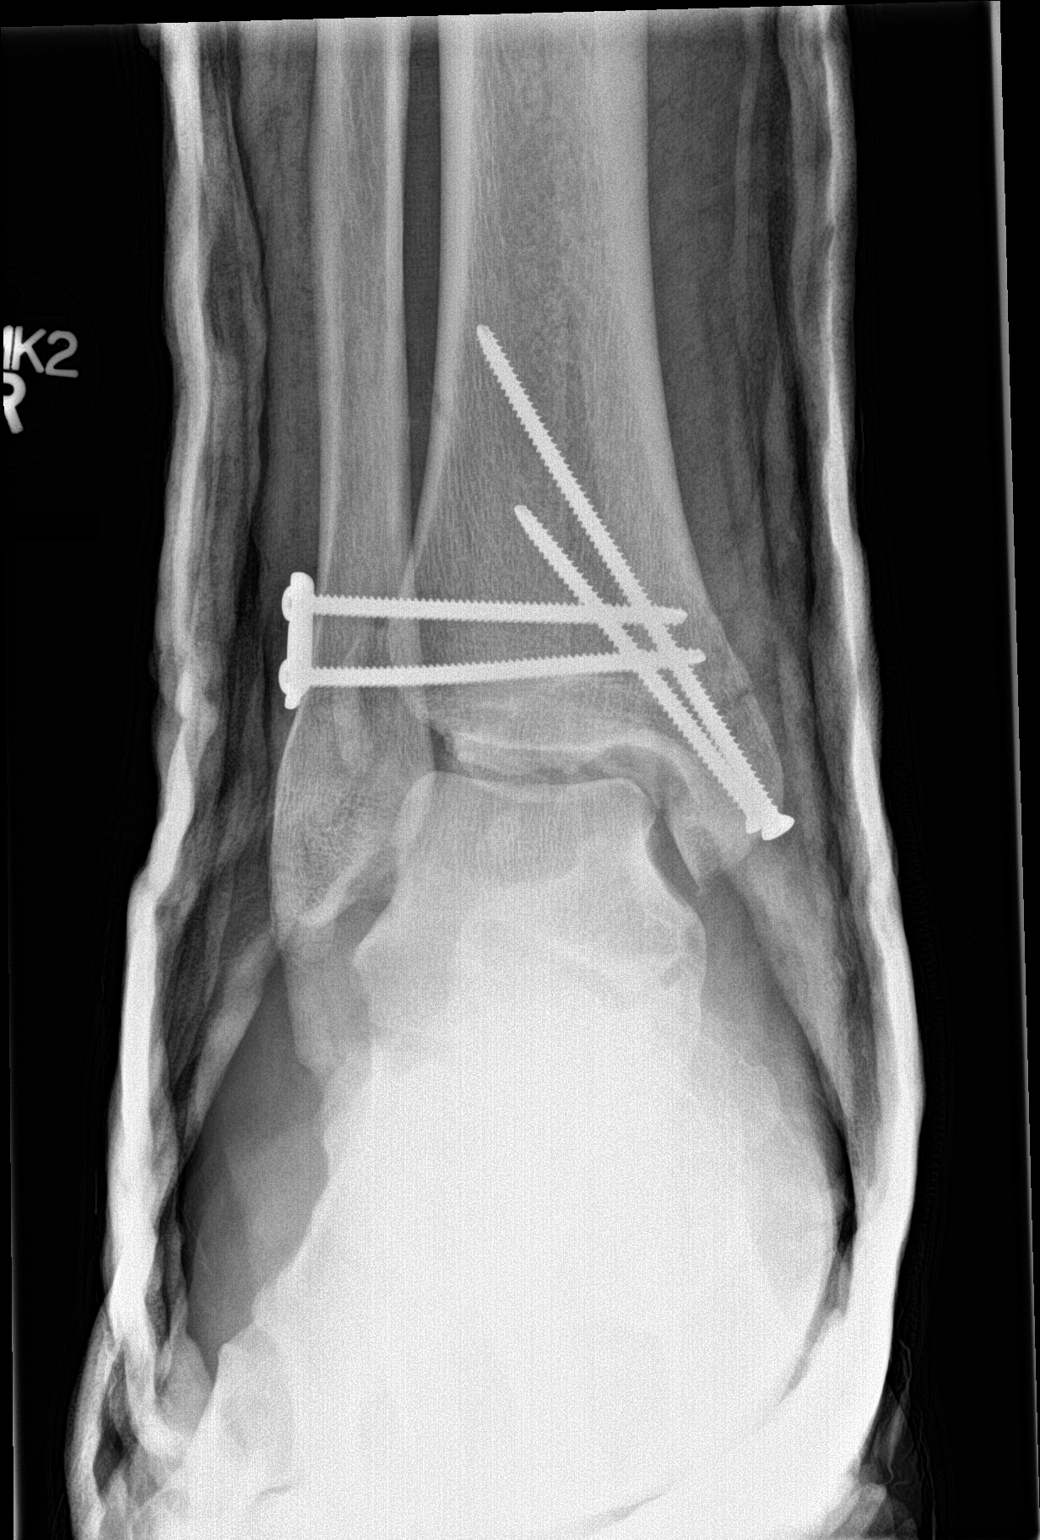

[ankle obl]
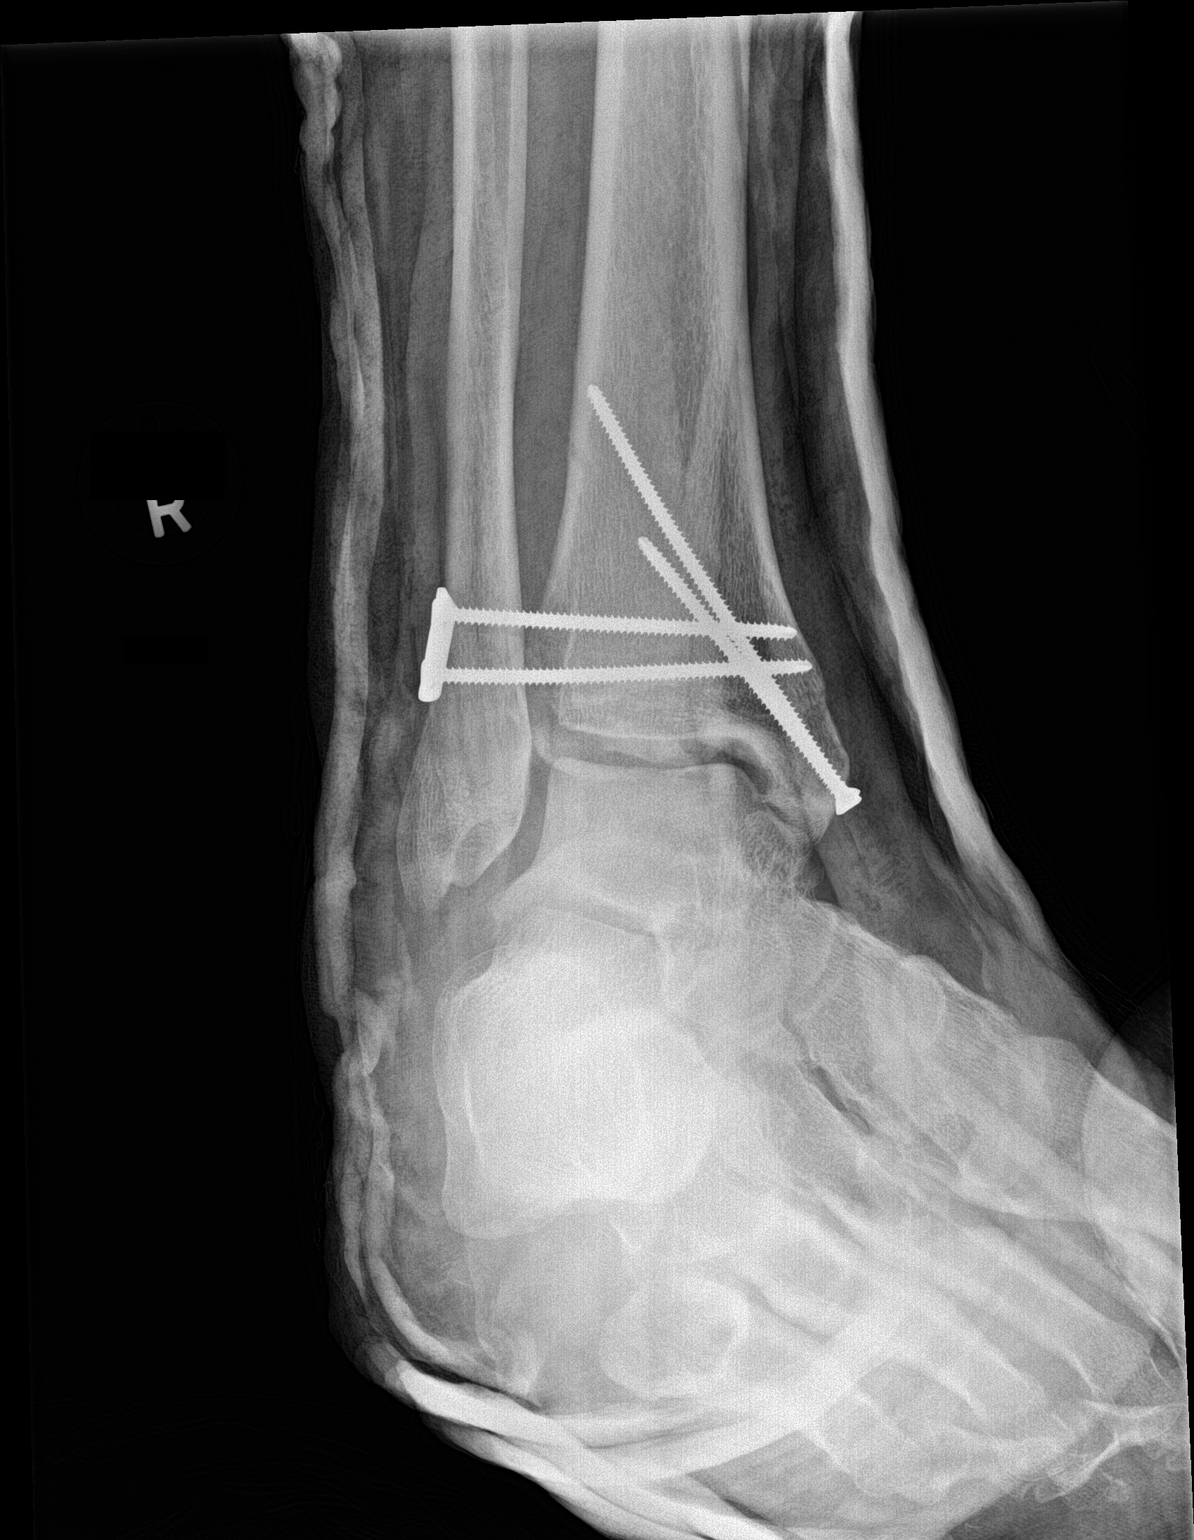

[ankle lat]
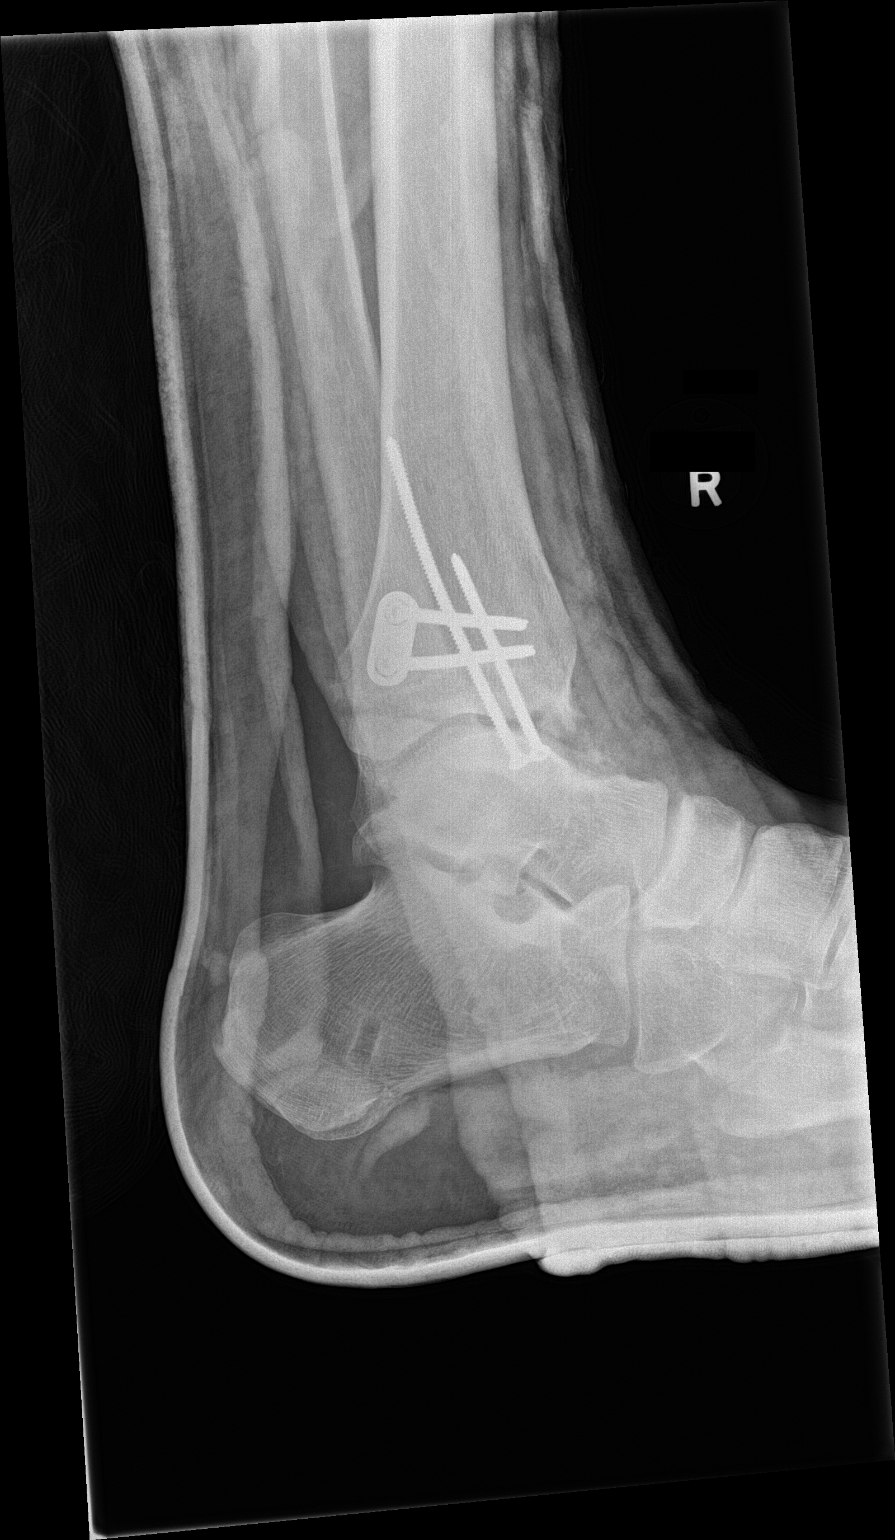

[3 of 3 positions shown; findings below may reference images not displayed]

FINDINGS: Interval 2 screw fixation of the medial malleolus, now in anatomic
alignment. Interval 2 screw fixation of the tibiofibular
syndesmosis. Acute nondisplaced transverse posterior malleolar
fracture again noted. The ankle mortise is symmetric. The talar dome
is intact. Joint spaces are preserved. Bone mineralization is
normal. Diffuse soft tissue swelling.
IMPRESSION: 1. Interval right ankle ORIF in anatomic alignment, without acute
postoperative complication.
